# Patient Record
Sex: Female | Born: 1990
Health system: Southern US, Community
[De-identification: ages and names within clinical notes are randomized; demographics above are authoritative.]

## PROBLEM LIST (undated history)

## (undated) ENCOUNTER — Inpatient Hospital Stay: Payer: Self-pay

## (undated) DIAGNOSIS — Z3490 Encounter for supervision of normal pregnancy, unspecified, unspecified trimester: Secondary | ICD-10-CM

## (undated) DIAGNOSIS — E039 Hypothyroidism, unspecified: Secondary | ICD-10-CM

## (undated) DIAGNOSIS — K429 Umbilical hernia without obstruction or gangrene: Secondary | ICD-10-CM

## (undated) DIAGNOSIS — E282 Polycystic ovarian syndrome: Secondary | ICD-10-CM

## (undated) DIAGNOSIS — R112 Nausea with vomiting, unspecified: Secondary | ICD-10-CM

## (undated) DIAGNOSIS — Z9889 Other specified postprocedural states: Secondary | ICD-10-CM

## (undated) HISTORY — PX: APPENDECTOMY: SHX54

## (undated) HISTORY — DX: Polycystic ovarian syndrome: E28.2

## (undated) HISTORY — DX: Hypothyroidism, unspecified: E03.9

## (undated) HISTORY — DX: Encounter for supervision of normal pregnancy, unspecified, unspecified trimester: Z34.90

---

## 2012-03-23 ENCOUNTER — Ambulatory Visit (INDEPENDENT_AMBULATORY_CARE_PROVIDER_SITE_OTHER): Payer: 59 | Admitting: Family Medicine

## 2012-03-23 VITALS — BP 116/62 | HR 88 | Temp 98.3°F | Resp 16 | Ht 65.5 in | Wt 172.0 lb

## 2012-03-23 DIAGNOSIS — M545 Low back pain, unspecified: Secondary | ICD-10-CM

## 2012-03-23 DIAGNOSIS — R112 Nausea with vomiting, unspecified: Secondary | ICD-10-CM

## 2012-03-23 DIAGNOSIS — R110 Nausea: Secondary | ICD-10-CM

## 2012-03-23 DIAGNOSIS — R350 Frequency of micturition: Secondary | ICD-10-CM

## 2012-03-23 DIAGNOSIS — Z139 Encounter for screening, unspecified: Secondary | ICD-10-CM

## 2012-03-23 DIAGNOSIS — N10 Acute tubulo-interstitial nephritis: Secondary | ICD-10-CM

## 2012-03-23 DIAGNOSIS — N12 Tubulo-interstitial nephritis, not specified as acute or chronic: Secondary | ICD-10-CM

## 2012-03-23 LAB — POCT URINALYSIS DIPSTICK
Bilirubin, UA: NEGATIVE
Glucose, UA: NEGATIVE
Ketones, UA: NEGATIVE
Nitrite, UA: NEGATIVE
Spec Grav, UA: 1.02
Urobilinogen, UA: 0.2
pH, UA: 6.5

## 2012-03-23 LAB — POCT UA - MICROSCOPIC ONLY
Casts, Ur, LPF, POC: NEGATIVE
Crystals, Ur, HPF, POC: NEGATIVE
Mucus, UA: NEGATIVE
RBC, urine, microscopic: NEGATIVE
Yeast, UA: NEGATIVE

## 2012-03-23 LAB — POCT URINE PREGNANCY: Preg Test, Ur: NEGATIVE

## 2012-03-23 MED ORDER — LEVOFLOXACIN 750 MG PO TABS: 750.0000 mg | ORAL_TABLET | Freq: Every day | ORAL | Status: AC

## 2012-03-23 MED ORDER — ONDANSETRON 4 MG PO TBDP: 4.0000 mg | ORAL_TABLET | Freq: Three times a day (TID) | ORAL | Status: AC | PRN

## 2012-03-23 MED ORDER — PHENAZOPYRIDINE HCL 100 MG PO TABS: 100.0000 mg | ORAL_TABLET | Freq: Three times a day (TID) | ORAL | Status: AC | PRN

## 2012-03-23 NOTE — Patient Instructions (Addendum)
Return to the clinic or go to the nearest emergency room if any of your symptoms worsen or new symptoms occur. Your should receive a call or letter about your lab results within the next week to 10 days.      Pyelonephritis, Adult Pyelonephritis is a kidney infection. In general, there are 2 main types of pyelonephritis:  Infections that come on quickly without any warning (acute pyelonephritis).   Infections that persist for a long period of time (chronic pyelonephritis).  CAUSES  Two main causes of pyelonephritis are:  Bacteria traveling from the bladder to the kidney. This is a problem especially in pregnant women. The urine in the bladder can become filled with bacteria from multiple causes, including:   Inflammation of the prostate gland (prostatitis).   Sexual intercourse in females.   Bladder infection (cystitis).   Bacteria traveling from the bloodstream to the tissue part of the kidney.  Problems that may increase your risk of getting a kidney infection include:  Diabetes.   Kidney stones or bladder stones.   Cancer.   Catheters placed in the bladder.   Other abnormalities of the kidney or ureter.  SYMPTOMS   Abdominal pain.   Pain in the side or flank area.   Fever.   Chills.   Upset stomach.   Blood in the urine (dark urine).   Frequent urination.   Strong or persistent urge to urinate.   Burning or stinging when urinating.  DIAGNOSIS  Your caregiver may diagnose your kidney infection based on your symptoms. A urine sample may also be taken. TREATMENT  In general, treatment depends on how severe the infection is.   If the infection is mild and caught early, your caregiver may treat you with oral antibiotics and send you home.   If the infection is more severe, the bacteria may have gotten into the bloodstream. This will require intravenous (IV) antibiotics and a hospital stay. Symptoms may include:   High fever.   Severe flank pain.    Shaking chills.   Even after a hospital stay, your caregiver may require you to be on oral antibiotics for a period of time.   Other treatments may be required depending upon the cause of the infection.  HOME CARE INSTRUCTIONS   Take your antibiotics as directed. Finish them even if you start to feel better.   Make an appointment to have your urine checked to make sure the infection is gone.   Drink enough fluids to keep your urine clear or pale yellow.   Take medicines for the bladder if you have urgency and frequency of urination as directed by your caregiver.  SEEK IMMEDIATE MEDICAL CARE IF:   You have a fever.   You are unable to take your antibiotics or fluids.   You develop shaking chills.   You experience extreme weakness or fainting.   There is no improvement after 2 days of treatment.  MAKE SURE YOU:  Understand these instructions.   Will watch your condition.   Will get help right away if you are not doing well or get worse.  Document Released: 08/17/2005 Document Revised: 08/06/2011 Document Reviewed: 01/21/2011 [facility] Patient Information 2012 [place], [place].

## 2012-03-23 NOTE — Progress Notes (Signed)
Subjective:    Patient ID: Adrienne Alvarado, female    DOB: 1991/01/28, 21 y.o.   MRN: [id number]  HPI Adrienne Alvarado is a 21 y.o. female  Low back pain for past month - worse past few days.  No fever.  Dysuria for 2 days - episodic.  Urinary frequency past 1 week. No hematuria.  Vomited last night and 3 days ago.  No fever.  NKI, no change in activity or work.    Tx: none.   LMP 03/14/12 - normal.  Sexually active - engaged, getting married in October. 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  Alcohol - 3 times per week.  Social tobacco use.   Review of Systems  Constitutional: Negative for fever and chills.  Gastrointestinal: Positive for nausea. Negative for vomiting and abdominal pain.  Genitourinary: Positive for dysuria, urgency and frequency. Negative for hematuria, decreased urine volume, vaginal bleeding, vaginal discharge and vaginal pain.  Musculoskeletal: Positive for back pain.  Skin: Negative for rash.       Objective:   Physical Exam  Constitutional: She is oriented to person, place, and time. She appears well-developed and well-nourished.  HENT:  Head: Normocephalic and atraumatic.  Pulmonary/Chest: Effort normal.  Abdominal: Soft. Normal appearance. She exhibits no distension. There is no tenderness. There is CVA tenderness (R sided. ). There is no rebound and no guarding.  Neurological: She is alert and oriented to person, place, and time.  Skin: Skin is warm.  Psychiatric: She has a normal mood and affect. Her behavior is normal.   Results for orders placed in visit on 03/23/12  POCT URINALYSIS DIPSTICK      Component Value Range   Color, UA yellow     Clarity, UA cloudy     Glucose, UA neg     Bilirubin, UA neg     Ketones, UA neg     Spec Grav, UA 1.020     Blood, UA small     pH, UA 6.5     Protein, UA trace     Urobilinogen, UA 0.2     Nitrite, UA neg     Leukocytes, UA small (1+)    POCT UA - MICROSCOPIC ONLY      Component Value Range   WBC, Ur, HPF,  POC 10-15     RBC, urine, microscopic neg     Bacteria, U Microscopic 1+     Mucus, UA neg     Epithelial cells, urine per micros 0-1     Crystals, Ur, HPF, POC neg     Casts, Ur, LPF, POC neg     Yeast, UA neg    POCT URINE PREGNANCY      Component Value Range   Preg Test, Ur Negative         Assessment & Plan:  Adrienne Alvarado is a 21 y.o. female  1. Urine frequency  POCT urinalysis dipstick, POCT UA - Microscopic Only, phenazopyridine (PYRIDIUM) 100 MG tablet  2. Low back pain  POCT urinalysis dipstick, POCT UA - Microscopic Only  3. Screening  POCT urine pregnancy  4. Pyelonephritis  levofloxacin (LEVAQUIN) 750 MG tablet, ondansetron (ZOFRAN ODT) 4 MG disintegrating tablet, Urine culture, phenazopyridine (PYRIDIUM) 100 MG tablet  5. Nausea  ondansetron (ZOFRAN ODT) 4 MG disintegrating tablet    UTI/Possible pyelo with R CVAT, vomiting/nausea. Levaquin 750mg  qd x 5 days. zofran Q8h prn. Fluids, pyridium prn..  Check urine culture.   Recheck in 48 hours if not much improved, sooner if worse. -  RTC precautions, understanding expressed.  Patient Instructions  Return to the clinic or go to the nearest emergency room if any of your symptoms worsen or new symptoms occur. Your should receive a call or letter about your lab results within the next week to 10 days.      Pyelonephritis, Adult Pyelonephritis is a kidney infection. In general, there are 2 main types of pyelonephritis:  Infections that come on quickly without any warning (acute pyelonephritis).   Infections that persist for a long period of time (chronic pyelonephritis).  CAUSES  Two main causes of pyelonephritis are:  Bacteria traveling from the bladder to the kidney. This is a problem especially in pregnant women. The urine in the bladder can become filled with bacteria from multiple causes, including:   Inflammation of the prostate gland (prostatitis).   Sexual intercourse in females.   Bladder  infection (cystitis).   Bacteria traveling from the bloodstream to the tissue part of the kidney.  Problems that may increase your risk of getting a kidney infection include:  Diabetes.   Kidney stones or bladder stones.   Cancer.   Catheters placed in the bladder.   Other abnormalities of the kidney or ureter.  SYMPTOMS   Abdominal pain.   Pain in the side or flank area.   Fever.   Chills.   Upset stomach.   Blood in the urine (dark urine).   Frequent urination.   Strong or persistent urge to urinate.   Burning or stinging when urinating.  DIAGNOSIS  Your caregiver may diagnose your kidney infection based on your symptoms. A urine sample may also be taken. TREATMENT  In general, treatment depends on how severe the infection is.   If the infection is mild and caught early, your caregiver may treat you with oral antibiotics and send you home.   If the infection is more severe, the bacteria may have gotten into the bloodstream. This will require intravenous (IV) antibiotics and a hospital stay. Symptoms may include:   High fever.   Severe flank pain.   Shaking chills.   Even after a hospital stay, your caregiver may require you to be on oral antibiotics for a period of time.   Other treatments may be required depending upon the cause of the infection.  HOME CARE INSTRUCTIONS   Take your antibiotics as directed. Finish them even if you start to feel better.   Make an appointment to have your urine checked to make sure the infection is gone.   Drink enough fluids to keep your urine clear or pale yellow.   Take medicines for the bladder if you have urgency and frequency of urination as directed by your caregiver.  SEEK IMMEDIATE MEDICAL CARE IF:   You have a fever.   You are unable to take your antibiotics or fluids.   You develop shaking chills.   You experience extreme weakness or fainting.   There is no improvement after 2 days of treatment.  MAKE  SURE YOU:  Understand these instructions.   Will watch your condition.   Will get help right away if you are not doing well or get worse.  Document Released: 08/17/2005 Document Revised: 08/06/2011 Document Reviewed: 01/21/2011 [facility] Patient Information 2012 [place], [place].

## 2012-03-26 LAB — URINE CULTURE: Colony Count: >=100000

## 2012-06-09 ENCOUNTER — Ambulatory Visit: Payer: 59 | Admitting: Family Medicine

## 2012-06-09 VITALS — BP 118/68 | HR 106 | Temp 98.5°F | Resp 17 | Ht 65.5 in | Wt 172.0 lb

## 2012-06-09 DIAGNOSIS — N390 Urinary tract infection, site not specified: Secondary | ICD-10-CM

## 2012-06-09 DIAGNOSIS — R300 Dysuria: Secondary | ICD-10-CM

## 2012-06-09 DIAGNOSIS — R319 Hematuria, unspecified: Secondary | ICD-10-CM

## 2012-06-09 LAB — POCT UA - MICROSCOPIC ONLY
Casts, Ur, LPF, POC: NEGATIVE
Crystals, Ur, HPF, POC: NEGATIVE
Yeast, UA: NEGATIVE

## 2012-06-09 LAB — POCT URINALYSIS DIPSTICK
Leukocytes, UA: NEGATIVE
Nitrite, UA: POSITIVE
Protein, UA: 30
Urobilinogen, UA: 2
pH, UA: 5

## 2012-06-09 MED ORDER — CIPROFLOXACIN HCL 500 MG PO TABS: 500.0000 mg | ORAL_TABLET | Freq: Two times a day (BID) | ORAL | Status: DC

## 2012-06-09 MED ORDER — SULFAMETHOXAZOLE-TRIMETHOPRIM 800-160 MG PO TABS: 1.0000 | ORAL_TABLET | Freq: Two times a day (BID) | ORAL | Status: DC

## 2012-06-09 NOTE — Progress Notes (Signed)
[address]   [place], [place]  [address]   [phone]  Subjective:    Patient ID: Adrienne Alvarado, female    DOB: [birth year], 21 y.o.   MRN: [id number]  HPIThis 21 y.o. female presents for evaluation of UTI symptoms. Two days ago onset of dysuria; last night hematuria.  No fever but +chills; no sweats.  +frequency.  Prescribed pain medication and took last night; taking Pyridium with relief.  Nocturia x multiple. +HA; no n/v.  No abdominal pain.  No vaginal discharge.  No vaginal irritation.  +sexually active; old partner; same partner x 3 years.  No history of STDs. History of two kidney infections this year; evaluated at [facility] and diagnosed with pyelonephritis a few months ago.  No flank pain.   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    Review of Systems  Constitutional: Negative for chills, diaphoresis and fatigue.  Gastrointestinal: Negative for nausea, vomiting, abdominal pain, diarrhea and constipation.  Genitourinary: Positive for dysuria, urgency, frequency and hematuria. Negative for flank pain, vaginal bleeding, vaginal discharge, genital sores, vaginal pain, menstrual problem, pelvic pain and dyspareunia.       Objective:   Physical Exam  Nursing note and vitals reviewed. Constitutional: She is oriented to person, place, and time. She appears well-developed and well-nourished. No distress.  Cardiovascular: Normal rate, regular rhythm and normal heart sounds.   Pulmonary/Chest: Effort normal and breath sounds normal.  Abdominal: Soft. Bowel sounds are normal. She exhibits no distension and no mass. There is no tenderness. There is no rebound, no guarding and no CVA tenderness. No hernia.  Neurological: She is alert and oriented to person, place, and time.  Skin: Skin is warm and dry. She is not diaphoretic.  Psychiatric: She  has a normal mood and affect. Her behavior is normal. Judgment and thought content normal.      Results for orders placed in visit on 06/09/12  POCT UA - MICROSCOPIC ONLY      Component Value Range   WBC, Ur, HPF, POC 10-15     RBC, urine, microscopic 1-3     Bacteria, U Microscopic trace     Mucus, UA neg     Epithelial cells, urine per micros 2-4     Crystals, Ur, HPF, POC neg     Casts, Ur, LPF, POC neg     Yeast, UA neg    POCT URINALYSIS DIPSTICK      Component Value Range   Color, UA orange     Clarity, UA clear     Glucose, UA neg     Bilirubin, UA neg     Ketones, UA trace     Spec Grav, UA 1.025     Blood, UA neg     pH, UA 5.0     Protein, UA 30     Urobilinogen, UA 2.0     Nitrite, UA pos     Leukocytes, UA Negative         Assessment & Plan:   1. Burning with urination  POCT UA - Microscopic Only, POCT urinalysis dipstick  2. UTI (lower urinary tract infection)  ciprofloxacin (CIPRO) 500 MG tablet, sulfamethoxazole-trimethoprim (BACTRIM DS,SEPTRA DS) 800-160 MG per tablet, Urine culture    1. UTI: New.  Third UTI in past year; previous pyelonephritis this year.  Send urine culture.  Treat with Septra DS bid  x 10 days.  RTC after completion of antibiotics to confirm normal urine; fast track card provided.  If has one additional UTI this year, recommend urology evaluation.  Pt expressed understanding.   RTC for development of fever, vomiting, flank pain. 2. Hematuria:  New.  Associated with UTI symptoms; RTC after completion of antibiotics to confirm resolution.

## 2012-06-09 NOTE — Patient Instructions (Addendum)
1. Burning with urination  POCT UA - Microscopic Only, POCT urinalysis dipstick  2. UTI (lower urinary tract infection)  ciprofloxacin (CIPRO) 500 MG tablet

## 2012-06-10 ENCOUNTER — Encounter: Payer: Self-pay | Admitting: Family Medicine

## 2012-06-10 NOTE — Progress Notes (Signed)
Reviewed and agree.

## 2012-06-12 LAB — URINE CULTURE

## 2014-03-28 ENCOUNTER — Inpatient Hospital Stay (HOSPITAL_COMMUNITY)
Admission: AD | Admit: 2014-03-28 | Discharge: 2014-03-28 | Disposition: A | Discharge status: Home / Self Care | Payer: 59 | Source: Ambulatory Visit | Attending: Obstetrics & Gynecology | Admitting: Obstetrics & Gynecology

## 2014-03-28 ENCOUNTER — Encounter (HOSPITAL_COMMUNITY): Payer: Self-pay | Admitting: *Deleted

## 2014-03-28 DIAGNOSIS — N949 Unspecified condition associated with female genital organs and menstrual cycle: Secondary | ICD-10-CM | POA: Diagnosis not present

## 2014-03-28 DIAGNOSIS — T192XXA Foreign body in vulva and vagina, initial encounter: Secondary | ICD-10-CM | POA: Diagnosis present

## 2014-03-28 DIAGNOSIS — R102 Pelvic and perineal pain: Secondary | ICD-10-CM

## 2014-03-28 NOTE — Discharge Instructions (Signed)
Toxic Shock Syndrome Toxic shock syndrome (TSS) is a disease that is caused by substances called toxins. The toxins that cause TSS may be made by three different types of bacteria. The toxins get into the bloodstream and damage many vital organs. Fever, rash, swelling of the hands and feet, shock, heart failure, kidney failure, liver failure, and lung damage have all been associated with TSS. TSS can also cause blood clotting and bleeding problems. TSS can be life-threatening. CAUSES  TSS occurs when one of three bacteria (Staphylococcus aureus, Streptococcus pyogenes, or Clostridium sordelli) enter the body through breaks or cracks in the skin or other tissue. The bacteria then produces toxins which enter the bloodstream and cause damage to many organs of the body. Staphylococcal TSS is caused by the bacteria Staphylococcus aureus. It can occur in menstruating women. Risk for this disease increases in women who use:  High absorbency tampons.  Diaphragms.  Sponges.  Cervical caps. Tampons can cause scrapes on the vaginal wall. This damage allows bacteria to enter into the vaginal wall. Super absorbent tampons are more harmful because they expand so much that they actually stick to the vaginal wall. When the tampon is removed, a layer of the vaginal lining may be scraped or peeled off. About half of all episodes of staphylococcal TSS occur in men, children, and menopausal women. In these cases, skin wounds or recent surgical cuts (incisions) are the site of entry for the bacteria. Streptococcal TSS is caused by the bacteria Streptococcus pyogenes. It may occur in healthy or ill children and adults of any age. The entry site of the bacteria into the body may not show obvious signs of infection. This disease can follow minor injuries such as blood clots under the skin, bruises, and muscle strain. It may also occur following chickenpox or a surgical procedure. People who have diabetes or alcoholism seem to  be at increased risk for this disease. Streptococcal TSS may also occur suddenly following childbirth. Clostridial TSS is caused by the bacteria Clostridium sordelli. This bacteria is naturally present in the vagina. The bacteria may enter a woman's tissue and cause this disease following childbirth, abortion, or a procedure or surgery on a woman's sexual organs. SYMPTOMS  Signs of an infection (warmth, redness, swelling, pain, or fluid drainage) where the bacteria entered the body may or may not be found. Symptoms of TSS may include:  High fever.  Vomiting.  Diarrhea.  Sunburn-like rash.  Low blood pressure.  Muscle aches and pains.  Headaches.  Sore throat.  Bloodshot eyes.  Confusion.  Swelling or peeling of the skin on the palms and soles of the feet.  Increase in heart rate. DIAGNOSIS  Your caregiver may perform some of the following tests to determine whether you have TSS:  Blood tests.  Blood, wound, and skin sample cultures.  Cultures from the vagina, mucus (sputum), and throat.  Chest X-rays.  Analysis of urine (urinalysis).  Lumbar puncture to check the spinal fluid. TREATMENT  Hospitalization is usually required. There is no way to predict which individuals with early TSS will develop severe medical problems. People who get proper treatment usually get better within 2 to 3 weeks. Treatment may include:  Immediate removal of the tampon, diaphragm, sponge, or cervical cap, if this applies.  Antibiotic medicines. Antibiotics may be given through an intravenous (IV) access.  IV fluids and medicines to raise low blood pressure and to replace fluids and body salts (sodium, potassium) lost from vomiting or diarrhea.  Dialysis if  there is severe kidney involvement. A kidney transplant may be necessary in rare cases.  Surgery to remove infected tissue. This is rare, except in some cases of streptococcal TSS. PREVENTION  Follow recommended tampon  guidelines.  Avoid super absorbent tampons.  Alternate the use of tampons with sanitary napkins or pads during your period. Use tampons during the day and pads at night.  Never leave a tampon inserted overnight.  Change tampons often, at least every 4 to 6 hours.  Do not leave in diaphragms, sponges, or cervical caps longer than instructed on the label.  Wash your hands with soap and warm water before inserting and removing a tampon, diaphragm, sponge, or cervical cap.  When inserting a tampon, be extremely careful not to scratch the vaginal lining. Plastic applicators may cause an increased risk because of the sharp edges produced when opening the applicator. If your vagina seems dry, use a water-soluble lubricating jelly. This will ease insertion.  Do not use a tampon between periods. It may dry out the vagina.  Use 100% cotton tampons instead of those made with synthetic materials.  Always choose the tampon with the least absorbency to meet your needs.  At the first sign of a fever or rash, remove the tampon immediately.  Do not leave your diaphragm or sponge in for longer than 12 hours.  Do not use tampons, a sponge, a cervical cap, or a diaphragm for 12 weeks after delivering a baby.  Do not use gauze packing for a nosebleed.  Wash cuts and scrapes with warm water and soap.  Keep scrapes and cuts covered to prevent infection from developing. HOME CARE INSTRUCTIONS  Take your antibiotics as directed. Finish them even if you start to feel better.  Women who had TSS should not use tampons ever again. SEEK IMMEDIATE MEDICAL CARE IF:   You have chills, vomiting, diarrhea, or a rash.  You have a convulsion or faint.  You have a cut or surgery and the skin around the wound becomes red, swollen, or drains pus.  You have a fever.  You have red streaks on the skin coming from an injury, piercing, or tattoo.  You just had a baby, within 12 weeks, and one of your children  has strep throat. Document Released: 11/07/2002 Document Revised: 11/09/2011 Document Reviewed: 04/16/2011 [facility] Patient Information 2015 [place], [place]. This information is not intended to replace advice given to you by your health care provider. Make sure you discuss any questions you have with your health care provider.

## 2014-03-28 NOTE — MAU Provider Note (Signed)
  History     CSN: [id number]  Arrival date and time: 03/28/14 0930   First Provider Initiated Contact with Patient 03/28/14 1007      Chief Complaint  Patient presents with  . stuck tampon    HPI [name] is a 23 y.o. G0P0 who presents to MAU today with concern for a retained tampon. The patient states that she last remembers placing the tampon on Sunday. She has noted some bleeding this morning, which she feels is trauma from the attempted removal. She is having vaginal discomfort. She denies abdominal pain, N/V or fever.   OB History   Grav Para Term Preterm Abortions TAB SAB Ect Mult Living   0               Past Medical History  Diagnosis Date  . Medical history non-contributory     Past Surgical History  Procedure Laterality Date  . Appendectomy      History reviewed. No pertinent family history.  History  Substance Use Topics  . Smoking status: Passive Smoke Exposure - Never Smoker -- 0.25 packs/day for 2 years    Types: Cigarettes  . Smokeless tobacco: Not on file  . Alcohol Use: 4.2 oz/week    7 Glasses of wine per week    Allergies:  Allergies  Allergen Reactions  . Amoxicillin     childhood  . Muscle Relief [Capsaicin] Nausea And Vomiting  . Penicillins Other (See Comments)    Childhood     No prescriptions prior to admission    Review of Systems  Constitutional: Negative for fever and malaise/fatigue.  Gastrointestinal: Negative for nausea, vomiting and abdominal pain.  Genitourinary:       + vaginal bleeding   Physical Exam   Blood pressure 116/76, pulse 67, temperature 99.1 F (37.3 C), temperature source Oral, resp. rate 18, height 5' 5.25" (1.657 m), weight 200 lb (90.719 kg), last menstrual period 03/22/2014, SpO2 100.00%.  Physical Exam  Constitutional: She is oriented to person, place, and time. She appears well-developed and well-nourished. No distress.  HENT:  Head: Normocephalic and atraumatic.   Cardiovascular: Normal rate.   Respiratory: Effort normal.  Genitourinary: Cervix exhibits no motion tenderness, no discharge and no friability. No bleeding around the vagina. There are signs of injury (small area of the anterior vaginal wall with erythema and scant bleeding) around the vagina. Vaginal discharge (scant thin, white discharge noted) found.  No retained tampon noted in the vagina  Neurological: She is alert and oriented to person, place, and time.  Skin: Skin is warm and dry. No erythema.  Psychiatric: She has a normal mood and affect.     MAU Course  Procedures None  MDM No foreign body noted on vaginal exam  Assessment and Plan  A: Vaginal pain  P: Discharge home Patient advised pelvic rest x 1-2 weeks Patient may return to MAU as needed or if her condition were to change or worsen   Freddi StarrJulie N Ethier, PA-C  03/28/2014, 10:08 AM

## 2014-03-28 NOTE — MAU Provider Note (Signed)
Attestation of Attending Supervision of Advanced Practitioner (PA/CNM/NP): Evaluation and management procedures were performed by the Advanced Practitioner under my supervision and collaboration.  I have reviewed the Advanced Practitioner's note and chart, and I agree with the management and plan.  Buster Schueller, MD, FACOG Attending Obstetrician & Gynecologist Faculty Practice, [facility]   

## 2014-03-28 NOTE — MAU Note (Signed)
Cycle ended on Sunday.  Realized yesterday had stuck tampon. Tired to remove it and was unable, causing pain and started bleeding from attempts.

## 2016-09-29 ENCOUNTER — Encounter: Payer: Self-pay | Admitting: Unknown Physician Specialty

## 2016-09-29 ENCOUNTER — Ambulatory Visit (INDEPENDENT_AMBULATORY_CARE_PROVIDER_SITE_OTHER): Payer: 59 | Admitting: Unknown Physician Specialty

## 2016-09-29 DIAGNOSIS — Z8639 Personal history of other endocrine, nutritional and metabolic disease: Secondary | ICD-10-CM | POA: Diagnosis not present

## 2016-09-29 DIAGNOSIS — E282 Polycystic ovarian syndrome: Secondary | ICD-10-CM

## 2016-09-29 DIAGNOSIS — Z7689 Persons encountering health services in other specified circumstances: Secondary | ICD-10-CM | POA: Diagnosis not present

## 2016-09-29 LAB — BAYER DCA HB A1C WAIVED: HB A1C (BAYER DCA - WAIVED): 5.3 % (ref <7.0)

## 2016-09-29 MED ORDER — METFORMIN HCL 500 MG PO TABS: 500.0000 mg | ORAL_TABLET | Freq: Three times a day (TID) | ORAL | 3 refills | Status: DC

## 2016-09-29 NOTE — Patient Instructions (Addendum)
Polycystic Ovarian Syndrome Polycystic ovarian syndrome (PCOS) is a common hormonal disorder among women of reproductive age. In most women with PCOS, many small fluid-filled sacs (cysts) grow on the ovaries, and the cysts are not part of a normal menstrual cycle. PCOS can cause problems with your menstrual periods and make it difficult to get pregnant. It can also cause an increased risk of miscarriage with pregnancy. If it is not treated, PCOS can lead to serious health problems, such as diabetes and heart disease. What are the causes? The cause of PCOS is not known, but it may be the result of a combination of certain factors, such as:  Irregular menstrual cycle.  High levels of certain hormones (androgens).  Problems with the hormone that helps to control blood sugar (insulin resistance).  Certain genes. What increases the risk? This condition is more likely to develop in women who have a family history of PCOS. What are the signs or symptoms? Symptoms of PCOS may include:  Multiple ovarian cysts.  Infrequent periods or no periods.  Periods that are too frequent or too heavy.  Unpredictable periods.  Inability to get pregnant (infertility) because of not ovulating.  Increased growth of hair on the face, chest, stomach, back, thumbs, thighs, or toes.  Acne or oily skin. Acne may develop during adulthood, and it may not respond to treatment.  Pelvic pain.  Weight gain or obesity.  Patches of thickened and dark brown or black skin on the neck, arms, breasts, or thighs (acanthosis nigricans).  Excess hair growth on the face, chest, abdomen, or upper thighs (hirsutism). How is this diagnosed? This condition is diagnosed based on:  Your medical history.  A physical exam, including a pelvic exam. Your health care provider may look for areas of increased hair growth on your skin.  Tests, such as:  Ultrasound. This may be used to examine the ovaries and the lining of the  uterus (endometrium) for cysts.  Blood tests. These may be used to check levels of sugar (glucose), female hormone (testosterone), and female hormones (estrogen and progesterone) in your blood. How is this treated? There is no cure for PCOS, but treatment can help to manage symptoms and prevent more health problems from developing. Treatment varies depending on:  Your symptoms.  Whether you want to have a baby or whether you need birth control (contraception). Treatment may include nutrition and lifestyle changes along with:  Progesterone hormone to start a menstrual period.  Birth control pills to help you have regular menstrual periods.  Medicines to make you ovulate, if you want to get pregnant.  Medicine to reduce excessive hair growth.  Surgery, in severe cases. This may involve making small holes in one or both of your ovaries. This decreases the amount of testosterone that your body produces. Follow these instructions at home:  Take over-the-counter and prescription medicines only as told by your health care provider.  Follow a healthy meal plan. This can help you reduce the effects of PCOS.  Eat a healthy diet that includes lean proteins, complex carbohydrates, fresh fruits and vegetables, low-fat dairy products, and healthy fats. Make sure to eat enough fiber.  If you are overweight, lose weight as told by your health care provider.  Losing 10% of your body weight may improve symptoms.  Your health care provider can determine how much weight loss is best for you and can help you lose weight safely.  Keep all follow-up visits as told by your health care provider. This  is important. Contact a health care provider if:  Your symptoms do not get better with medicine.  You develop new symptoms. This information is not intended to replace advice given to you by your health care provider. Make sure you discuss any questions you have with your health care provider. Document  Released: 12/11/2004 Document Revised: 04/14/2016 Document Reviewed: 02/02/2016 Elsevier Interactive Patient Education  2017 [employer]  

## 2016-09-29 NOTE — Progress Notes (Signed)
BP 106/69 (BP Location: Left Arm, Patient Position: Sitting, Cuff Size: Large)   Pulse 76   Temp 98.3 F (36.8 C)   Ht 5' 7.2" (1.707 m)   Wt 228 lb 3.2 oz (103.5 kg)   LMP 09/28/2016 (Exact Date)   SpO2 97%   BMI 35.53 kg/m    Subjective:    Patient ID: Adrienne Alvarado, female    DOB: 02-11-1991, 26 y.o.   MRN: [id number]  HPI: Adrienne Alvarado is a 26 y.o. female  Chief Complaint  Patient presents with  . Establish Care    pt states she would like to discuss weight gain, PCOS, and thyroid. States she had hypothyroid in the past   . PCOS    pt states she was diagnosed about 2 and a half years ago with PCOS and states she has high testosterone levels    Social History   Social History  . Marital status: Single    Spouse name: N/A  . Number of children: N/A  . Years of education: N/A   Occupational History  . Not on file.   Social History Main Topics  . Smoking status: Former Smoker    Packs/day: 0.25    Years: 2.00    Types: Cigarettes    Quit date: 02/03/2016  . Smokeless tobacco: Never Used  . Alcohol use Yes     Comment: on occasion  . Drug use: No  . Sexual activity: Yes    Birth control/ protection: None   Other Topics Concern  . Not on file   Social History Narrative   Marital status: single     Children: none     Employment: bartender   Family History  Problem Relation Age of Onset  . Cancer Mother     breast  . Diabetes Mother     type 2   Past Medical History:  Diagnosis Date  . Hypothyroid   . Medical history non-contributory   . PCOS (polycystic ovarian syndrome)    Past Surgical History:  Procedure Laterality Date  . APPENDECTOMY     PCOS Diagnosed August 2015 and had a vaginal ultrasound.  She was told her testosterone levels were "very high." She has gained over 100 pounds in the last several years.  Initially went to the doctor because of weight gain.  Particularly concerned about hair growth on chin and around nipples.  Worked  with a Systems analystpersonal trainer and no weight loss despite dieting and exercise (exercise 4 days/week, paleo/keto diet).  She lacks sex drive.  In the past she was on BCPs and they made her very sick.  She would like to start a family in the next couple of years.  She has not used anything for birth control for the past 5 years and never got pregnant.    Relevant past medical, surgical, family and social history reviewed and updated as indicated. Interim medical history since our last visit reviewed. Allergies and medications reviewed and updated.  Review of Systems  Per HPI unless specifically indicated above     Objective:    BP 106/69 (BP Location: Left Arm, Patient Position: Sitting, Cuff Size: Large)   Pulse 76   Temp 98.3 F (36.8 C)   Ht 5' 7.2" (1.707 m)   Wt 228 lb 3.2 oz (103.5 kg)   LMP 09/28/2016 (Exact Date)   SpO2 97%   BMI 35.53 kg/m   Wt Readings from Last 3 Encounters:  09/29/16 228 lb 3.2 oz (103.5 kg)  03/28/14 200 lb (90.7 kg)  06/09/12 172 lb (78 kg)    Physical Exam  Constitutional: She is oriented to person, place, and time. She appears well-developed and well-nourished. No distress.  HENT:  Head: Normocephalic and atraumatic.  Eyes: Conjunctivae and lids are normal. Right eye exhibits no discharge. Left eye exhibits no discharge. No scleral icterus.  Neck: Normal range of motion. Neck supple. No JVD present. Carotid bruit is not present.  Cardiovascular: Normal rate, regular rhythm and normal heart sounds.   Pulmonary/Chest: Effort normal and breath sounds normal.  Abdominal: Normal appearance. There is no splenomegaly or hepatomegaly.  Musculoskeletal: Normal range of motion.  Neurological: She is alert and oriented to person, place, and time.  Skin: Skin is warm, dry and intact. No rash noted. No pallor.  Psychiatric: She has a normal mood and affect. Her behavior is normal. Judgment and thought content normal.     Assessment & Plan:   Problem List Items  Addressed This Visit      Unprioritized   Encounter to establish care   History of hypothyroidism as a child   Relevant Orders   TSH   PCOS (polycystic ovarian syndrome)    Rx for Metformin with meals.  Gradual increase in dosing.  Recheck 6 weeks and consider addition of Contrave which helped her mom but will need back up birth control      Relevant Medications   metFORMIN (GLUCOPHAGE) 500 MG tablet   Other Relevant Orders   CBC with Differential/Platelet   Comprehensive metabolic panel   Lipid Panel w/o Chol/HDL Ratio   Bayer DCA Hb A1c Waived      Fasting today  Follow up plan: Return in about 6 weeks (around 11/10/2016).

## 2016-09-29 NOTE — Assessment & Plan Note (Signed)
Rx for Metformin with meals.  Gradual increase in dosing.  Recheck 6 weeks and consider addition of Contrave which helped her mom but will need back up birth control

## 2016-09-30 ENCOUNTER — Encounter: Payer: Self-pay | Admitting: Unknown Physician Specialty

## 2016-09-30 LAB — CBC WITH DIFFERENTIAL/PLATELET
BASOS ABS: 0 10*3/uL (ref 0.0–0.2)
Basos: 1 %
EOS (ABSOLUTE): 0.2 10*3/uL (ref 0.0–0.4)
EOS: 4 %
HEMATOCRIT: 38.2 % (ref 34.0–46.6)
HEMOGLOBIN: 12.4 g/dL (ref 11.1–15.9)
Immature Grans (Abs): 0 10*3/uL (ref 0.0–0.1)
Immature Granulocytes: 0 %
LYMPHS: 39 %
Lymphocytes Absolute: 2.1 10*3/uL (ref 0.7–3.1)
MCH: 29 pg (ref 26.6–33.0)
MCHC: 32.5 g/dL (ref 31.5–35.7)
MCV: 90 fL (ref 79–97)
MONOCYTES: 10 %
Monocytes Absolute: 0.5 10*3/uL (ref 0.1–0.9)
Neutrophils Absolute: 2.6 10*3/uL (ref 1.4–7.0)
Neutrophils: 46 %
Platelets: 378 10*3/uL (ref 150–379)
RBC: 4.27 x10E6/uL (ref 3.77–5.28)
RDW: 13.3 % (ref 12.3–15.4)
WBC: 5.5 10*3/uL (ref 3.4–10.8)

## 2016-09-30 LAB — COMPREHENSIVE METABOLIC PANEL
ALBUMIN: 3.9 g/dL (ref 3.5–5.5)
ALT: 20 IU/L (ref 0–32)
AST: 26 IU/L (ref 0–40)
Albumin/Globulin Ratio: 1.4 (ref 1.2–2.2)
Alkaline Phosphatase: 38 IU/L — ABNORMAL LOW (ref 39–117)
BUN/Creatinine Ratio: 15 (ref 9–23)
BUN: 9 mg/dL (ref 6–20)
Bilirubin Total: 0.2 mg/dL (ref 0.0–1.2)
CALCIUM: 8.9 mg/dL (ref 8.7–10.2)
CHLORIDE: 106 mmol/L (ref 96–106)
CO2: 19 mmol/L (ref 18–29)
Creatinine, Ser: 0.62 mg/dL (ref 0.57–1.00)
GFR, EST AFRICAN AMERICAN: 144 mL/min/{1.73_m2} (ref >59)
GFR, EST NON AFRICAN AMERICAN: 125 mL/min/{1.73_m2} (ref >59)
GLUCOSE: 91 mg/dL (ref 65–99)
Globulin, Total: 2.7 g/dL (ref 1.5–4.5)
Potassium: 4.8 mmol/L (ref 3.5–5.2)
Sodium: 140 mmol/L (ref 134–144)
TOTAL PROTEIN: 6.6 g/dL (ref 6.0–8.5)

## 2016-09-30 LAB — LIPID PANEL W/O CHOL/HDL RATIO
Cholesterol, Total: 126 mg/dL (ref 100–199)
HDL: 39 mg/dL — AB (ref >39)
LDL CALC: 77 mg/dL (ref 0–99)
Triglycerides: 52 mg/dL (ref 0–149)
VLDL CHOLESTEROL CAL: 10 mg/dL (ref 5–40)

## 2016-09-30 LAB — TSH: TSH: 2.4 u[IU]/mL (ref 0.450–4.500)

## 2016-09-30 NOTE — Progress Notes (Signed)
Notified pt by mychart

## 2016-11-23 ENCOUNTER — Ambulatory Visit: Payer: 59 | Admitting: Unknown Physician Specialty

## 2016-11-26 DIAGNOSIS — H5213 Myopia, bilateral: Secondary | ICD-10-CM | POA: Diagnosis not present

## 2016-11-26 DIAGNOSIS — H52223 Regular astigmatism, bilateral: Secondary | ICD-10-CM | POA: Diagnosis not present

## 2016-11-30 ENCOUNTER — Encounter: Payer: Self-pay | Admitting: Unknown Physician Specialty

## 2016-11-30 ENCOUNTER — Ambulatory Visit (INDEPENDENT_AMBULATORY_CARE_PROVIDER_SITE_OTHER): Payer: 59 | Admitting: Unknown Physician Specialty

## 2016-11-30 DIAGNOSIS — E282 Polycystic ovarian syndrome: Secondary | ICD-10-CM | POA: Diagnosis not present

## 2016-11-30 MED ORDER — METFORMIN HCL 500 MG PO TABS: 500.0000 mg | ORAL_TABLET | Freq: Three times a day (TID) | ORAL | 1 refills | Status: DC

## 2016-11-30 NOTE — Assessment & Plan Note (Signed)
Doing well with Metformin.  Continue present

## 2016-11-30 NOTE — Progress Notes (Signed)
BP 110/74 (BP Location: Left Arm, Patient Position: Sitting, Cuff Size: Large)   Pulse 94   Temp 98.8 F (37.1 C)   Wt 202 lb 6.4 oz (91.8 kg)   LMP 10/31/2016 (Exact Date)   SpO2 97%   BMI 31.51 kg/m    Subjective:    Patient ID: Adrienne Alvarado, female    DOB: 17-Nov-1990, 26 y.o.   MRN: [id number]  HPI: Adrienne Alvarado is a 26 y.o. female  Chief Complaint  Patient presents with  . PCOS    f/up after starting metformin   PCOS - Pt started on Metformin last visit.  She has lost a lot of weight with this regimen.  She has lost 16 pounds.  She is still concerned with hair growth.  She continue with her low carb diet and exercising.  She feels well.  Menses are regular and not on birth control  Relevant past medical, surgical, family and social history reviewed and updated as indicated. Interim medical history since our last visit reviewed. Allergies and medications reviewed and updated.  Review of Systems  All other systems reviewed and are negative.   Per HPI unless specifically indicated above     Objective:    BP 110/74 (BP Location: Left Arm, Patient Position: Sitting, Cuff Size: Large)   Pulse 94   Temp 98.8 F (37.1 C)   Wt 202 lb 6.4 oz (91.8 kg)   LMP 10/31/2016 (Exact Date)   SpO2 97%   BMI 31.51 kg/m   Wt Readings from Last 3 Encounters:  11/30/16 202 lb 6.4 oz (91.8 kg)  09/29/16 228 lb 3.2 oz (103.5 kg)  03/28/14 200 lb (90.7 kg)    Physical Exam  Constitutional: She is oriented to person, place, and time. She appears well-developed and well-nourished. No distress.  HENT:  Head: Normocephalic and atraumatic.  Eyes: Conjunctivae and lids are normal. Right eye exhibits no discharge. Left eye exhibits no discharge. No scleral icterus.  Neck: Normal range of motion. Neck supple. No JVD present. Carotid bruit is not present.  Cardiovascular: Normal rate, regular rhythm and normal heart sounds.   Pulmonary/Chest: Effort normal and breath sounds normal.    Abdominal: Normal appearance. There is no splenomegaly or hepatomegaly.  Musculoskeletal: Normal range of motion.  Neurological: She is alert and oriented to person, place, and time.  Skin: Skin is warm, dry and intact. No rash noted. No pallor.  Psychiatric: She has a normal mood and affect. Her behavior is normal. Judgment and thought content normal.    Results for orders placed or performed in visit on 09/29/16  CBC with Differential/Platelet  Result Value Ref Range   WBC 5.5 3.4 - 10.8 x10E3/uL   RBC 4.27 3.77 - 5.28 x10E6/uL   Hemoglobin 12.4 11.1 - 15.9 g/dL   Hematocrit 40.9 81.1 - 46.6 %   MCV 90 79 - 97 fL   MCH 29.0 26.6 - 33.0 pg   MCHC 32.5 31.5 - 35.7 g/dL   RDW 91.4 78.2 - 95.6 %   Platelets 378 150 - 379 x10E3/uL   Neutrophils 46 Not Estab. %   Lymphs 39 Not Estab. %   Monocytes 10 Not Estab. %   Eos 4 Not Estab. %   Basos 1 Not Estab. %   Neutrophils Absolute 2.6 1.4 - 7.0 x10E3/uL   Lymphocytes Absolute 2.1 0.7 - 3.1 x10E3/uL   Monocytes Absolute 0.5 0.1 - 0.9 x10E3/uL   EOS (ABSOLUTE) 0.2 0.0 - 0.4 x10E3/uL  Basophils Absolute 0.0 0.0 - 0.2 x10E3/uL   Immature Granulocytes 0 Not Estab. %   Immature Grans (Abs) 0.0 0.0 - 0.1 x10E3/uL  TSH  Result Value Ref Range   TSH 2.400 0.450 - 4.500 uIU/mL  Comprehensive metabolic panel  Result Value Ref Range   Glucose 91 65 - 99 mg/dL   BUN 9 6 - 20 mg/dL   Creatinine, Ser 4.09 0.57 - 1.00 mg/dL   GFR calc non Af Amer 125 >59 mL/min/1.73   GFR calc Af Amer 144 >59 mL/min/1.73   BUN/Creatinine Ratio 15 9 - 23   Sodium 140 134 - 144 mmol/L   Potassium 4.8 3.5 - 5.2 mmol/L   Chloride 106 96 - 106 mmol/L   CO2 19 18 - 29 mmol/L   Calcium 8.9 8.7 - 10.2 mg/dL   Total Protein 6.6 6.0 - 8.5 g/dL   Albumin 3.9 3.5 - 5.5 g/dL   Globulin, Total 2.7 1.5 - 4.5 g/dL   Albumin/Globulin Ratio 1.4 1.2 - 2.2   Bilirubin Total 0.2 0.0 - 1.2 mg/dL   Alkaline Phosphatase 38 (L) 39 - 117 IU/L   AST 26 0 - 40 IU/L   ALT 20 0  - 32 IU/L  Lipid Panel w/o Chol/HDL Ratio  Result Value Ref Range   Cholesterol, Total 126 100 - 199 mg/dL   Triglycerides 52 0 - 149 mg/dL   HDL 39 (L) >81 mg/dL   VLDL Cholesterol Cal 10 5 - 40 mg/dL   LDL Calculated 77 0 - 99 mg/dL  Bayer DCA Hb X9J Waived  Result Value Ref Range   Bayer DCA Hb A1c Waived 5.3 <7.0 %      Assessment & Plan:   Problem List Items Addressed This Visit      Unprioritized   PCOS (polycystic ovarian syndrome)    Doing well with Metformin.  Continue present      Relevant Medications   metFORMIN (GLUCOPHAGE) 500 MG tablet   Other Relevant Orders   Bayer DCA Hb A1c Waived   Comprehensive metabolic panel       Follow up plan: Return in about 6 months (around 06/01/2017).

## 2016-12-01 LAB — COMPREHENSIVE METABOLIC PANEL
ALK PHOS: 44 IU/L (ref 39–117)
ALT: 15 IU/L (ref 0–32)
AST: 15 IU/L (ref 0–40)
Albumin/Globulin Ratio: 1.6 (ref 1.2–2.2)
Albumin: 4.3 g/dL (ref 3.5–5.5)
BILIRUBIN TOTAL: 0.2 mg/dL (ref 0.0–1.2)
BUN/Creatinine Ratio: 19 (ref 9–23)
BUN: 14 mg/dL (ref 6–20)
CHLORIDE: 102 mmol/L (ref 96–106)
CO2: 19 mmol/L (ref 18–29)
CREATININE: 0.74 mg/dL (ref 0.57–1.00)
Calcium: 9.2 mg/dL (ref 8.7–10.2)
GFR calc Af Amer: 129 mL/min/{1.73_m2} (ref >59)
GFR calc non Af Amer: 112 mL/min/{1.73_m2} (ref >59)
GLUCOSE: 80 mg/dL (ref 65–99)
Globulin, Total: 2.7 g/dL (ref 1.5–4.5)
Potassium: 3.9 mmol/L (ref 3.5–5.2)
Sodium: 138 mmol/L (ref 134–144)
Total Protein: 7 g/dL (ref 6.0–8.5)

## 2016-12-01 LAB — BAYER DCA HB A1C WAIVED: HB A1C: 5.1 % (ref <7.0)

## 2016-12-01 NOTE — Progress Notes (Signed)
Normal labs.  Pt notified through mychart

## 2017-06-07 ENCOUNTER — Ambulatory Visit: Payer: 59 | Admitting: Unknown Physician Specialty

## 2017-06-29 ENCOUNTER — Other Ambulatory Visit: Payer: Self-pay | Admitting: Unknown Physician Specialty

## 2017-06-29 DIAGNOSIS — E282 Polycystic ovarian syndrome: Secondary | ICD-10-CM

## 2017-08-31 NOTE — L&D Delivery Note (Signed)
      Delivery Note   Adrienne Alvarado is a 27 y.o. G1P0 at [redacted]w[redacted]d Estimated Date of Delivery: 06/21/18  PRE-OPERATIVE DIAGNOSIS:  1) [redacted]w[redacted]d pregnancy.   POST-OPERATIVE DIAGNOSIS:  1) [redacted]w[redacted]d pregnancy s/p Vaginal, Spontaneous   Delivery Type: Vaginal, Spontaneous    Delivery Anesthesia: Epidural   Labor Complications:   none     ESTIMATED BLOOD LOSS: 200 ml    FINDINGS:   1) female infant, Apgar scores of 8    at 1 minute and 9    at 5 minutes and a birthweight of 109  ounces.  ( 6lbs 13oz.)  2) Nuchal cord: No   SPECIMENS:   PLACENTA:   Appearance:   intact , 3 vessel cord   Removal: Spontaneous      Disposition:   held per protocol then discarded  DISPOSITION:  Infant to left in stable condition in the delivery room, with L&D personnel and mother,  NARRATIVE SUMMARY: Labor course:  [name] is a G1P0 at [redacted]w[redacted]d who presented for induction of labor.  She progressed well in labor with pitocin.  She received the appropriate anesthesia and proceeded to complete dilation. She evidenced good maternal expulsive effort during the second stage. She went on to deliver a viable female infant " Adrienne Alvarado". The placenta delivered without problems and was noted to be complete. A perineal and vaginal examination was performed. Lacerations: 2nd degree;Perineal . Laceration was repaired with 3-0 Vicryl Rapide suture. The patient tolerated this well.  Doreene Burke, CNM  06/24/2018 6:44 PM

## 2017-10-06 ENCOUNTER — Other Ambulatory Visit: Payer: Self-pay | Admitting: Unknown Physician Specialty

## 2017-10-06 DIAGNOSIS — E282 Polycystic ovarian syndrome: Secondary | ICD-10-CM

## 2017-10-06 NOTE — Telephone Encounter (Signed)
Pt due to be seen

## 2017-10-25 ENCOUNTER — Telehealth: Payer: Self-pay | Admitting: Certified Nurse Midwife

## 2017-11-02 ENCOUNTER — Ambulatory Visit (INDEPENDENT_AMBULATORY_CARE_PROVIDER_SITE_OTHER): Payer: 59 | Admitting: Certified Nurse Midwife

## 2017-11-02 ENCOUNTER — Encounter: Payer: Self-pay | Admitting: Certified Nurse Midwife

## 2017-11-02 ENCOUNTER — Encounter: Payer: 59 | Admitting: Certified Nurse Midwife

## 2017-11-02 VITALS — BP 121/54 | HR 76 | Ht 67.5 in | Wt 182.3 lb

## 2017-11-02 DIAGNOSIS — N912 Amenorrhea, unspecified: Secondary | ICD-10-CM | POA: Diagnosis not present

## 2017-11-02 LAB — POCT URINE PREGNANCY: Preg Test, Ur: POSITIVE — AB

## 2017-11-02 MED ORDER — DOXYLAMINE-PYRIDOXINE ER 20-20 MG PO TBCR: 1.0000 | EXTENDED_RELEASE_TABLET | Freq: Three times a day (TID) | ORAL | 2 refills | Status: DC

## 2017-11-02 NOTE — Patient Instructions (Signed)

## 2017-11-02 NOTE — Progress Notes (Signed)
Subjective:    Adrienne Alvarado is a 27 y.o. female who presents for evaluation of amenorrhea. She believes she could be pregnant. Pregnancy is desired. Sexual Activity: single partner, contraception: none. Current symptoms also include: fatigue, nausea and positive home pregnancy test. Last period was normal.   Patient's last menstrual period was 09/14/2017 (exact date). The following portions of the patient's history were reviewed and updated as appropriate: allergies, current medications, past family history, past medical history, past social history, past surgical history and problem list.  Review of Systems Pertinent items are noted in HPI.     Objective:    BP (!) 121/54   Pulse 76   Ht 5' 7.5" (1.715 m)   Wt 182 lb 5 oz (82.7 kg)   LMP 09/14/2017 (Exact Date)   BMI 28.13 kg/m  General: alert, cooperative, appears stated age and no acute distress    Lab Review Urine HCG: positive    Assessment:    Absence of menstruation.     Plan:    Pregnancy Test: Briefly discussed pre-natal care options. [facility]. Pt encouraged to let us know when she has decided on care team.  Encouraged well-balanced diet, plenty of rest when needed, pre-natal vitamins daily and exercise. Discussed self-help for nausea, avoiding OTC medications until consulting provider or pharmacist, other than Tylenol as needed, minimal caffeine (1-2 cups daily) and avoiding alcohol. Bonjesta ordered today. She will schedule her initial nurse visit @ 10 wks and her NOB physical exam @ 12 wks. She verbalizes understanding and agrees.   Doreene BurkeAnnie Clyde Upshaw, CNM

## 2017-11-02 NOTE — Progress Notes (Signed)
Pt is here for a confirmation of pregnancy. C/o N/V breast tenderness. Samples of Bonjesta given to pt but script also sent in.

## 2017-11-04 ENCOUNTER — Other Ambulatory Visit: Payer: Self-pay

## 2017-11-04 MED ORDER — VITAFOL GUMMIES 3.33-0.333-34.8 MG PO CHEW: 1.0000 | CHEWABLE_TABLET | Freq: Every day | ORAL | 9 refills | Status: DC

## 2017-11-04 MED ORDER — VITAFOL GUMMIES 3.33-0.333-34.8 MG PO CHEW: 3.0000 | CHEWABLE_TABLET | Freq: Every day | ORAL | 9 refills | Status: DC

## 2017-11-18 NOTE — Telephone Encounter (Signed)
Error

## 2017-11-21 ENCOUNTER — Encounter: Payer: Self-pay | Admitting: Certified Nurse Midwife

## 2017-11-22 ENCOUNTER — Ambulatory Visit (INDEPENDENT_AMBULATORY_CARE_PROVIDER_SITE_OTHER): Payer: 59 | Admitting: Certified Nurse Midwife

## 2017-11-22 ENCOUNTER — Other Ambulatory Visit: Payer: Self-pay

## 2017-11-22 VITALS — BP 109/70 | HR 79 | Ht 67.5 in | Wt 180.9 lb

## 2017-11-22 DIAGNOSIS — Z202 Contact with and (suspected) exposure to infections with a predominantly sexual mode of transmission: Secondary | ICD-10-CM

## 2017-11-22 DIAGNOSIS — Z3401 Encounter for supervision of normal first pregnancy, first trimester: Secondary | ICD-10-CM | POA: Diagnosis not present

## 2017-11-22 DIAGNOSIS — O219 Vomiting of pregnancy, unspecified: Secondary | ICD-10-CM

## 2017-11-22 LAB — OB RESULTS CONSOLE VARICELLA ZOSTER ANTIBODY, IGG: VARICELLA IGG: IMMUNE

## 2017-11-22 MED ORDER — DOXYLAMINE-PYRIDOXINE 10-10 MG PO TBEC: 1.0000 | DELAYED_RELEASE_TABLET | Freq: Three times a day (TID) | ORAL | 1 refills | Status: DC

## 2017-11-22 MED ORDER — DOXYLAMINE-PYRIDOXINE 10-10 MG PO TBEC: 10.0000 mg | DELAYED_RELEASE_TABLET | Freq: Every day | ORAL | 1 refills | Status: DC

## 2017-11-22 NOTE — Progress Notes (Signed)
Adrienne Alvarado presents for Adrienne Alvarado. Pregnancy confirmation done __[facility] by [facility] on 3/5/2019_.  G1- .  P000. LMP- 09/14/2017 (certain) EDD- 06/21/2018. Pt c/o of n/v. Given Bonjesta at confirmation. Pt can not afford it. Sent in [facility]ic[facility]- per [facility] no charge to pt.   Pregnancy education material explained and given. _0__ cats in the home. Adrienne labs ordered.  HIV labs and Drug screen were explained and ordered.  PNV encouraged. Genetic screening options discussed. Genetic testing: Unsure. Last pap 2015- wnl. Pt may discuss with provider at [facility]. Pt. To follow up with provider in_ 2_ weeks for Adrienne physical.  All questions answered.

## 2017-11-22 NOTE — Patient Instructions (Signed)
WHAT OB PATIENTS CAN EXPECT   Confirmation of pregnancy and ultrasound ordered if medically indicated-[redacted] weeks gestation  New OB (NOB) intake with nurse and New OB (NOB) labs- [redacted] weeks gestation  New OB (NOB) physical examination with provider- 11/[redacted] weeks gestation  Flu vaccine-[redacted] weeks gestation  Anatomy scan-[redacted] weeks gestation  Glucose tolerance test, blood work to test for anemia, T-dap vaccine-[redacted] weeks gestation  Vaginal swabs/cultures-STD/Group B strep-[redacted] weeks gestation  Appointments every 4 weeks until 28 weeks  Every 2 weeks from 28 weeks until 36 weeks  Weekly visits from 36 weeks until delivery  Morning Sickness Morning sickness is when you feel sick to your stomach (nauseous) during pregnancy. You may feel sick to your stomach and throw up (vomit). You may feel sick in the morning, but you can feel this way any time of day. Some women feel very sick to their stomach and cannot stop throwing up (hyperemesis gravidarum). Follow these instructions at home:  Only take medicines as told by your doctor.  Take multivitamins as told by your doctor. Taking multivitamins before getting pregnant can stop or lessen the harshness of morning sickness.  Eat dry toast or unsalted crackers before getting out of bed.  Eat 5 to 6 small meals a day.  Eat dry and bland foods like rice and baked potatoes.  Do not drink liquids with meals. Drink between meals.  Do not eat greasy, fatty, or spicy foods.  Have someone cook for you if the smell of food causes you to feel sick or throw up.  If you feel sick to your stomach after taking prenatal vitamins, take them at night or with a snack.  Eat protein when you need a snack (nuts, yogurt, cheese).  Eat unsweetened gelatins for dessert.  Wear a bracelet used for sea sickness (acupressure wristband).  Go to a doctor that puts thin needles into certain body points (acupuncture) to improve how you feel.  Do not smoke.  Use a  humidifier to keep the air in your house free of odors.  Get lots of fresh air. Contact a doctor if:  You need medicine to feel better.  You feel dizzy or lightheaded.  You are losing weight. Get help right away if:  You feel very sick to your stomach and cannot stop throwing up.  You pass out (faint). This information is not intended to replace advice given to you by your health care provider. Make sure you discuss any questions you have with your health care provider. Document Released: 09/24/2004 Document Revised: 01/23/2016 Document Reviewed: 02/01/2013 Elsevier Interactive Patient Education  2017 [place] of Pregnancy The first trimester of pregnancy is from week 1 until the end of week 13 (months 1 through 3). A week after a sperm fertilizes an egg, the egg will implant on the wall of the uterus. This embryo will begin to develop into a baby. Genes from you and your partner will form the baby. The female genes will determine whether the baby will be a boy or a girl. At 6-8 weeks, the eyes and face will be formed, and the heartbeat can be seen on ultrasound. At the end of 12 weeks, all the baby's organs will be formed. Now that you are pregnant, you will want to do everything you can to have a healthy baby. Two of the most important things are to get good prenatal care and to follow your health care provider's instructions. Prenatal care is all the medical care you  receive before the baby's birth. This care will help prevent, find, and treat any problems during the pregnancy and childbirth. Body changes during your first trimester Your body goes through many changes during pregnancy. The changes vary from woman to woman.  You may gain or lose a couple of pounds at first.  You may feel sick to your stomach (nauseous) and you may throw up (vomit). If the vomiting is uncontrollable, call your health care provider.  You may tire easily.  You may develop headaches  that can be relieved by medicines. All medicines should be approved by your health care provider.  You may urinate more often. Painful urination may mean you have a bladder infection.  You may develop heartburn as a result of your pregnancy.  You may develop constipation because certain hormones are causing the muscles that push stool through your intestines to slow down.  You may develop hemorrhoids or swollen veins (varicose veins).  Your breasts may begin to grow larger and become tender. Your nipples may stick out more, and the tissue that surrounds them (areola) may become darker.  Your gums may bleed and may be sensitive to brushing and flossing.  Dark spots or blotches (chloasma, mask of pregnancy) may develop on your face. This will likely fade after the baby is born.  Your menstrual periods will stop.  You may have a loss of appetite.  You may develop cravings for certain kinds of food.  You may have changes in your emotions from day to day, such as being excited to be pregnant or being concerned that something may go wrong with the pregnancy and baby.  You may have more vivid and strange dreams.  You may have changes in your hair. These can include thickening of your hair, rapid growth, and changes in texture. Some women also have hair loss during or after pregnancy, or hair that feels dry or thin. Your hair will most likely return to normal after your baby is born.  What to expect at prenatal visits During a routine prenatal visit:  You will be weighed to make sure you and the baby are growing normally.  Your blood pressure will be taken.  Your abdomen will be measured to track your baby's growth.  The fetal heartbeat will be listened to between weeks 10 and 14 of your pregnancy.  Test results from any previous visits will be discussed.  Your health care provider may ask you:  How you are feeling.  If you are feeling the baby move.  If you have had any  abnormal symptoms, such as leaking fluid, bleeding, severe headaches, or abdominal cramping.  If you are using any tobacco products, including cigarettes, chewing tobacco, and electronic cigarettes.  If you have any questions.  Other tests that may be performed during your first trimester include:  Blood tests to find your blood type and to check for the presence of any previous infections. The tests will also be used to check for low iron levels (anemia) and protein on red blood cells (Rh antibodies). Depending on your risk factors, or if you previously had diabetes during pregnancy, you may have tests to check for high blood sugar that affects pregnant women (gestational diabetes).  Urine tests to check for infections, diabetes, or protein in the urine.  An ultrasound to confirm the proper growth and development of the baby.  Fetal screens for spinal cord problems (spina bifida) and Down syndrome.  HIV (human immunodeficiency virus) testing. Routine prenatal testing  includes screening for HIV, unless you choose not to have this test.  You may need other tests to make sure you and the baby are doing well.  Follow these instructions at home: Medicines  Follow your health care provider's instructions regarding medicine use. Specific medicines may be either safe or unsafe to take during pregnancy.  Take a prenatal vitamin that contains at least 600 micrograms (mcg) of folic acid.  If you develop constipation, try taking a stool softener if your health care provider approves. Eating and drinking  Eat a balanced diet that includes fresh fruits and vegetables, whole grains, good sources of protein such as meat, eggs, or tofu, and low-fat dairy. Your health care provider will help you determine the amount of weight gain that is right for you.  Avoid raw meat and uncooked cheese. These carry germs that can cause birth defects in the baby.  Eating four or five small meals rather than three  large meals a day may help relieve nausea and vomiting. If you start to feel nauseous, eating a few soda crackers can be helpful. Drinking liquids between meals, instead of during meals, also seems to help ease nausea and vomiting.  Limit foods that are high in fat and processed sugars, such as fried and sweet foods.  To prevent constipation: ? Eat foods that are high in fiber, such as fresh fruits and vegetables, whole grains, and beans. ? Drink enough fluid to keep your urine clear or pale yellow. Activity  Exercise only as directed by your health care provider. Most women can continue their usual exercise routine during pregnancy. Try to exercise for 30 minutes at least 5 days a week. Exercising will help you: ? Control your weight. ? Stay in shape. ? Be prepared for labor and delivery.  Experiencing pain or cramping in the lower abdomen or lower back is a good sign that you should stop exercising. Check with your health care provider before continuing with normal exercises.  Try to avoid standing for long periods of time. Move your legs often if you must stand in one place for a long time.  Avoid heavy lifting.  Wear low-heeled shoes and practice good posture.  You may continue to have sex unless your health care provider tells you not to. Relieving pain and discomfort  Wear a good support bra to relieve breast tenderness.  Take warm sitz baths to soothe any pain or discomfort caused by hemorrhoids. Use hemorrhoid cream if your health care provider approves.  Rest with your legs elevated if you have leg cramps or low back pain.  If you develop varicose veins in your legs, wear support hose. Elevate your feet for 15 minutes, 3-4 times a day. Limit salt in your diet. Prenatal care  Schedule your prenatal visits by the twelfth week of pregnancy. They are usually scheduled monthly at first, then more often in the last 2 months before delivery.  Write down your questions. Take them  to your prenatal visits.  Keep all your prenatal visits as told by your health care provider. This is important. Safety  Wear your seat belt at all times when driving.  Make a list of emergency phone numbers, including numbers for family, friends, the hospital, and police and fire departments. General instructions  Ask your health care provider for a referral to a local prenatal education class. Begin classes no later than the beginning of month 6 of your pregnancy.  Ask for help if you have counseling or nutritional  needs during pregnancy. Your health care provider can offer advice or refer you to specialists for help with various needs.  Do not use hot tubs, steam rooms, or saunas.  Do not douche or use tampons or scented sanitary pads.  Do not cross your legs for long periods of time.  Avoid cat litter boxes and soil used by cats. These carry germs that can cause birth defects in the baby and possibly loss of the fetus by miscarriage or stillbirth.  Avoid all smoking, herbs, alcohol, and medicines not prescribed by your health care provider. Chemicals in these products affect the formation and growth of the baby.  Do not use any products that contain nicotine or tobacco, such as cigarettes and e-cigarettes. If you need help quitting, ask your health care provider. You may receive counseling support and other resources to help you quit.  Schedule a dentist appointment. At home, brush your teeth with a soft toothbrush and be gentle when you floss. Contact a health care provider if:  You have dizziness.  You have mild pelvic cramps, pelvic pressure, or nagging pain in the abdominal area.  You have persistent nausea, vomiting, or diarrhea.  You have a bad smelling vaginal discharge.  You have pain when you urinate.  You notice increased swelling in your face, hands, legs, or ankles.  You are exposed to fifth disease or chickenpox.  You are exposed to [place] measles (rubella)  and have never had it. Get help right away if:  You have a fever.  You are leaking fluid from your vagina.  You have spotting or bleeding from your vagina.  You have severe abdominal cramping or pain.  You have rapid weight gain or loss.  You vomit blood or material that looks like coffee grounds.  You develop a severe headache.  You have shortness of breath.  You have any kind of trauma, such as from a fall or a car accident. Summary  The first trimester of pregnancy is from week 1 until the end of week 13 (months 1 through 3).  Your body goes through many changes during pregnancy. The changes vary from woman to woman.  You will have routine prenatal visits. During those visits, your health care provider will examine you, discuss any test results you may have, and talk with you about how you are feeling. This information is not intended to replace advice given to you by your health care provider. Make sure you discuss any questions you have with your health care provider. Document Released: 08/11/2001 Document Revised: 07/29/2016 Document Reviewed: 07/29/2016 Elsevier Interactive Patient Education  2018 [employer]. Commonly Asked Questions During Pregnancy  Cats: A parasite can be excreted in cat feces.  To avoid exposure you need to have another person empty the little box.  If you must empty the litter box you will need to wear gloves.  Wash your hands after handling your cat.  This parasite can also be found in raw or undercooked meat so this should also be avoided.  Colds, Sore Throats, Flu: Please check your medication sheet to see what you can take for symptoms.  If your symptoms are unrelieved by these medications please call the office.  Dental Work: Most any dental work Investment banker, corporate recommends is permitted.  X-rays should only be taken during the first trimester if absolutely necessary.  Your abdomen should be shielded with a lead apron during all x-rays.  Please  notify your provider prior to receiving any x-rays.  Novocaine  is fine; gas is not recommended.  If your dentist requires a note from [place] prior to dental work please call the office and we will provide one for you.  Exercise: Exercise is an important part of staying healthy during your pregnancy.  You may continue most exercises you were accustomed to prior to pregnancy.  Later in your pregnancy you will most likely notice you have difficulty with activities requiring balance like riding a bicycle.  It is important that you listen to your body and avoid activities that put you at a higher risk of falling.  Adequate rest and staying well hydrated are a must!  If you have questions about the safety of specific activities ask your provider.    Exposure to Children with illness: Try to avoid obvious exposure; report any symptoms to [place] when noted,  If you have chicken pos, red measles or mumps, you should be immune to these diseases.   Please do not take any vaccines while pregnant unless you have checked with your OB provider.  Fetal Movement: After 28 weeks we recommend you do "kick counts" twice daily.  Lie or sit down in a calm quiet environment and count your baby movements "kicks".  You should feel your baby at least 10 times per hour.  If you have not felt 10 kicks within the first hour get up, walk around and have something sweet to eat or drink then repeat for an additional hour.  If count remains less than 10 per hour notify your provider.  Fumigating: Follow your pest control agent's advice as to how long to stay out of your home.  Ventilate the area well before re-entering.  Hemorrhoids:   Most over-the-counter preparations can be used during pregnancy.  Check your medication to see what is safe to use.  It is important to use a stool softener or fiber in your diet and to drink lots of liquids.  If hemorrhoids seem to be getting worse please call the office.   Hot Tubs:  Hot tubs Jacuzzis and  saunas are not recommended while pregnant.  These increase your internal body temperature and should be avoided.  Intercourse:  Sexual intercourse is safe during pregnancy as long as you are comfortable, unless otherwise advised by your provider.  Spotting may occur after intercourse; report any bright red bleeding that is heavier than spotting.  Labor:  If you know that you are in labor, please go to the hospital.  If you are unsure, please call the office and let us help you decide what to do.  Lifting, straining, etc:  If your job requires heavy lifting or straining please check with your provider for any limitations.  Generally, you should not lift items heavier than that you can lift simply with your hands and arms (no back muscles)  Painting:  Paint fumes do not harm your pregnancy, but may make you ill and should be avoided if possible.  Latex or water based paints have less odor than oils.  Use adequate ventilation while painting.  Permanents & Hair Color:  Chemicals in hair dyes are not recommended as they cause increase hair dryness which can increase hair loss during pregnancy.  " Highlighting" and permanents are allowed.  Dye may be absorbed differently and permanents may not hold as well during pregnancy.  Sunbathing:  Use a sunscreen, as skin burns easily during pregnancy.  Drink plenty of fluids; avoid over heating.  Tanning Beds:  Because their possible side effects are still  unknown, tanning beds are not recommended.  Ultrasound Scans:  Routine ultrasounds are performed at approximately 20 weeks.  You will be able to see your baby's general anatomy an if you would like to know the gender this can usually be determined as well.  If it is questionable when you conceived you may also receive an ultrasound early in your pregnancy for dating purposes.  Otherwise ultrasound exams are not routinely performed unless there is a medical necessity.  Although you can request a scan we ask that  you pay for it when conducted because insurance does not cover " patient request" scans.  Work: If your pregnancy proceeds without complications you may work until your due date, unless your physician or employer advises otherwise.  Round Ligament Pain/Pelvic Discomfort:  Sharp, shooting pains not associated with bleeding are fairly common, usually occurring in the second trimester of pregnancy.  They tend to be worse when standing up or when you remain standing for long periods of time.  These are the result of pressure of certain pelvic ligaments called "round ligaments".  Rest, Tylenol and heat seem to be the most effective relief.  As the womb and fetus grow, they rise out of the pelvis and the discomfort improves.  Please notify the office if your pain seems different than that described.  It may represent a more serious condition.  Common Medications Safe in Pregnancy  Acne:      Constipation:  Benzoyl Peroxide     Colace  Clindamycin      Dulcolax Suppository  Topica Erythromycin     Fibercon  Salicylic Acid      Metamucil         Miralax AVOID:        Senakot   Accutane    Cough:  Retin-A       Cough Drops  Tetracycline      Phenergan w/ Codeine if Rx  Minocycline      Robitussin (Plain & DM)  Antibiotics:     Crabs/Lice:  Ceclor       RID  Cephalosporins    AVOID:  E-Mycins      Kwell  Keflex  Macrobid/Macrodantin   Diarrhea:  Penicillin      Kao-Pectate  Zithromax      Imodium AD         PUSH FLUIDS AVOID:       Cipro     Fever:  Tetracycline      Tylenol (Regular or Extra  Minocycline       Strength)  Levaquin      Extra Strength-Do not          Exceed 8 tabs/24 hrs Caffeine:        <270m/day (equiv. To 1 cup of coffee or  approx. 3 12 oz sodas)         Gas: Cold/Hayfever:       Gas-X  Benadryl      Mylicon  Claritin       Phazyme  **Claritin-D        Chlor-Trimeton    Headaches:  Dimetapp      ASA-Free Excedrin  Drixoral-Non-Drowsy     Cold Compress  Mucinex  (Guaifenasin)     Tylenol (Regular or Extra  Sudafed/Sudafed-12 Hour     Strength)  **Sudafed PE Pseudoephedrine   Tylenol Cold & Sinus     Vicks Vapor Rub  Zyrtec  **AVOID if Problems With Blood Pressure  Heartburn: Avoid lying down for at least 1 hour after meals  Aciphex      Maalox     Rash:  Milk of Magnesia     Benadryl    Mylanta       1% Hydrocortisone Cream  Pepcid  Pepcid Complete   Sleep Aids:  Prevacid      Ambien   Prilosec       Benadryl  Rolaids       Chamomile Tea  Tums (Limit 4/day)     Unisom  Zantac       Tylenol PM         Warm milk-add vanilla or  Hemorrhoids:       Sugar for taste  Anusol/Anusol H.C.  (RX: Analapram 2.5%)  Sugar Substitutes:  Hydrocortisone OTC     Ok in moderation  Preparation H      Tucks        Vaseline lotion applied to tissue with wiping    Herpes:     Throat:  Acyclovir      Oragel  Famvir  Valtrex     Vaccines:         Flu Shot Leg Cramps:       *Gardasil  Benadryl      Hepatitis A         Hepatitis B Nasal Spray:       Pneumovax  Saline Nasal Spray     Polio Booster         Tetanus Nausea:       Tuberculosis test or PPD  Vitamin B6 25 mg TID   AVOID:    Dramamine      *Gardasil  Emetrol       Live Poliovirus  Ginger Root 250 mg QID    MMR (measles, mumps &  High Complex Carbs @ Bedtime    rebella)  Sea Bands-Accupressure    Varicella (Chickenpox)  Unisom 1/2 tab TID     *No known complications           If received before Pain:         Known pregnancy;   Darvocet       Resume series after  Lortab        Delivery  Percocet    Yeast:   Tramadol      Femstat  Tylenol 3      Gyne-lotrimin  Ultram       Monistat  Vicodin           MISC:         All Sunscreens           Hair Coloring/highlights          Insect Repellant's          (Including DEET)         Mystic Tans

## 2017-11-23 LAB — DRUG PROFILE, UR, 9 DRUGS (LABCORP)
AMPHETAMINES, URINE: NEGATIVE ng/mL
BARBITURATE QUANT UR: NEGATIVE ng/mL
BENZODIAZEPINE QUANT UR: NEGATIVE ng/mL
CANNABINOID QUANT UR: NEGATIVE ng/mL
Cocaine (Metab.): NEGATIVE ng/mL
Methadone Screen, Urine: NEGATIVE ng/mL
Opiate Quant, Ur: NEGATIVE ng/mL
PCP Quant, Ur: NEGATIVE ng/mL
Propoxyphene: NEGATIVE ng/mL

## 2017-11-23 LAB — URINALYSIS, ROUTINE W REFLEX MICROSCOPIC
BILIRUBIN UA: NEGATIVE
Glucose, UA: NEGATIVE
KETONES UA: NEGATIVE
Leukocytes, UA: NEGATIVE
Nitrite, UA: NEGATIVE
Protein, UA: NEGATIVE
RBC UA: NEGATIVE
SPEC GRAV UA: 1.015 (ref 1.005–1.030)
UUROB: 0.2 mg/dL (ref 0.2–1.0)
pH, UA: 8 — ABNORMAL HIGH (ref 5.0–7.5)

## 2017-11-24 LAB — CULTURE, OB URINE

## 2017-11-24 LAB — GC/CHLAMYDIA PROBE AMP
Chlamydia trachomatis, NAA: NEGATIVE
Neisseria gonorrhoeae by PCR: NEGATIVE

## 2017-11-24 LAB — URINE CULTURE, OB REFLEX: Organism ID, Bacteria: NO GROWTH

## 2017-11-25 LAB — HIV ANTIBODY (ROUTINE TESTING W REFLEX): HIV SCREEN 4TH GENERATION: NONREACTIVE

## 2017-11-25 LAB — CBC WITH DIFFERENTIAL/PLATELET
BASOS ABS: 0 10*3/uL (ref 0.0–0.2)
Basos: 0 %
EOS (ABSOLUTE): 0.2 10*3/uL (ref 0.0–0.4)
Eos: 2 %
HEMOGLOBIN: 12.1 g/dL (ref 11.1–15.9)
Hematocrit: 36 % (ref 34.0–46.6)
IMMATURE GRANS (ABS): 0 10*3/uL (ref 0.0–0.1)
IMMATURE GRANULOCYTES: 0 %
LYMPHS: 26 %
Lymphocytes Absolute: 2.6 10*3/uL (ref 0.7–3.1)
MCH: 29.3 pg (ref 26.6–33.0)
MCHC: 33.6 g/dL (ref 31.5–35.7)
MCV: 87 fL (ref 79–97)
MONOCYTES: 6 %
Monocytes Absolute: 0.6 10*3/uL (ref 0.1–0.9)
NEUTROS PCT: 66 %
Neutrophils Absolute: 6.7 10*3/uL (ref 1.4–7.0)
PLATELETS: 334 10*3/uL (ref 150–379)
RBC: 4.13 x10E6/uL (ref 3.77–5.28)
RDW: 13.2 % (ref 12.3–15.4)
WBC: 10.1 10*3/uL (ref 3.4–10.8)

## 2017-11-25 LAB — NICOTINE/COTININE METABOLITES
Cotinine: NOT DETECTED ng/mL
Nicotine: NOT DETECTED ng/mL

## 2017-11-25 LAB — ABO AND RH: RH TYPE: POSITIVE

## 2017-11-25 LAB — ANTIBODY SCREEN: ANTIBODY SCREEN: NEGATIVE

## 2017-11-25 LAB — VARICELLA ZOSTER ANTIBODY, IGG: Varicella zoster IgG: 275 index (ref >165)

## 2017-11-25 LAB — RPR: RPR Ser Ql: NONREACTIVE

## 2017-11-25 LAB — RUBELLA SCREEN: Rubella Antibodies, IGG: <0.9 index — ABNORMAL LOW (ref >0.99)

## 2017-11-25 LAB — HEPATITIS B SURFACE ANTIGEN: HEP B S AG: NEGATIVE

## 2017-11-30 ENCOUNTER — Encounter: Payer: Self-pay | Admitting: Certified Nurse Midwife

## 2017-12-06 ENCOUNTER — Ambulatory Visit (INDEPENDENT_AMBULATORY_CARE_PROVIDER_SITE_OTHER): Payer: 59 | Admitting: Certified Nurse Midwife

## 2017-12-06 ENCOUNTER — Encounter: Payer: Self-pay | Admitting: Certified Nurse Midwife

## 2017-12-06 VITALS — BP 115/70 | HR 65 | Wt 180.2 lb

## 2017-12-06 DIAGNOSIS — Z3401 Encounter for supervision of normal first pregnancy, first trimester: Secondary | ICD-10-CM

## 2017-12-06 LAB — POCT URINALYSIS DIPSTICK
Bilirubin, UA: NEGATIVE
Blood, UA: NEGATIVE
Glucose, UA: NEGATIVE
KETONES UA: NEGATIVE
Leukocytes, UA: NEGATIVE
NITRITE UA: NEGATIVE
PROTEIN UA: NEGATIVE
SPEC GRAV UA: 1.01 (ref 1.010–1.025)
UROBILINOGEN UA: 0.2 U/dL
pH, UA: 6.5 (ref 5.0–8.0)

## 2017-12-06 MED ORDER — ONDANSETRON HCL 4 MG PO TABS: 4.0000 mg | ORAL_TABLET | Freq: Three times a day (TID) | ORAL | 0 refills | Status: DC | PRN

## 2017-12-06 NOTE — Progress Notes (Signed)
Pt is here for a new OB visit. Still having vomiting despite Diclegis. Declined genetic testing.

## 2017-12-06 NOTE — Progress Notes (Signed)
NEW OB HISTORY AND PHYSICAL  SUBJECTIVE:       Paulo FruitDanielle Andy is a 27 y.o. G1P0 female, Patient's last menstrual period was 09/14/2017 (exact date)., Estimated Date of Delivery: 06/21/18, [id number], presents today for establishment of Prenatal Care. She has no unusual complaints and complains of nausea with vomiting.  days      Gynecologic History Patient's last menstrual period was 09/14/2017 (exact date). Normal Contraception: none Last Pap: 4-5 yrs ago. Results were: normal per pt.   Obstetric History OB History  Gravida Para Term Preterm AB Living  1            SAB TAB Ectopic Multiple Live Births               # Outcome Date GA Lbr Len/2nd Weight Sex Delivery Anes PTL Lv  1 Current             Past Medical History:  Diagnosis Date  . Hypothyroid   . Medical history non-contributory   . PCOS (polycystic ovarian syndrome)   . PCOS (polycystic ovarian syndrome)     Past Surgical History:  Procedure Laterality Date  . APPENDECTOMY      Current Outpatient Medications on File Prior to Visit  Medication Sig Dispense Refill  . Doxylamine-Pyridoxine 10-10 MG TBEC Take 1 tablet by mouth 3 (three) times daily. 120 tablet 1  . Prenatal Vit-Fe Fumarate-FA (PRENATAL MULTIVITAMIN) TABS tablet Take 1 tablet by mouth daily at 12 noon.     No current facility-administered medications on file prior to visit.     Allergies  Allergen Reactions  . Amoxicillin     childhood  . Methocarbamol Other (See Comments)    Nausea, anxiety, chills  . Muscle Relief [Capsaicin] Nausea And Vomiting  . Penicillins Other (See Comments)    Childhood     Social History   Socioeconomic History  . Marital status: Single    Spouse name: Not on file  . Number of children: Not on file  . Years of education: Not on file  . Highest education level: Not on file  Occupational History  . Not on file  Social Needs  . Financial resource strain: Not on file  . Food insecurity:    Worry: Not on file     Inability: Not on file  . Transportation needs:    Medical: Not on file    Non-medical: Not on file  Tobacco Use  . Smoking status: Former Smoker    Packs/day: 0.25    Years: 2.00    Pack years: 0.50    Types: Cigarettes    Last attempt to quit: 02/03/2016    Years since quitting: 1.8  . Smokeless tobacco: Never Used  Substance and Sexual Activity  . Alcohol use: Yes    Comment: on occasion  . Drug use: No  . Sexual activity: Yes    Birth control/protection: None  Lifestyle  . Physical activity:    Days per week: Not on file    Minutes per session: Not on file  . Stress: Not on file  Relationships  . Social connections:    Talks on phone: Not on file    Gets together: Not on file    Attends religious service: Not on file    Active member of club or organization: Not on file    Attends meetings of clubs or organizations: Not on file    Relationship status: Not on file  . Intimate partner violence:  Fear of current or ex partner: Not on file    Emotionally abused: Not on file    Physically abused: Not on file    Forced sexual activity: Not on file  Other Topics Concern  . Not on file  Social History Narrative   Marital status: single     Children: none     Employment: bartender    Family History  Problem Relation Age of Onset  . Cancer Mother        breast  . Diabetes Mother        type 2  . Multiple sclerosis Mother   . Breast cancer Mother   . Ovarian cancer Neg Hx   . Colon cancer Neg Hx     The following portions of the patient's history were reviewed and updated as appropriate: allergies, current medications, past OB history, past medical history, past surgical history, past family history, past social history, and problem list.    OBJECTIVE: Initial Physical Exam (New OB)  GENERAL APPEARANCE: alert, well appearing, in no apparent distress, oriented to person, place and time HEAD: normocephalic, atraumatic MOUTH: mucous membranes moist, pharynx  normal without lesions THYROID: no thyromegaly or masses present BREASTS: no masses noted, no significant tenderness, no palpable axillary nodes, no skin changes LUNGS: clear to auscultation, no wheezes, rales or rhonchi, symmetric air entry HEART: regular rate and rhythm, no murmurs ABDOMEN: obese, fundus soft, nontender 11 weeks size and FHT present EXTREMITIES: no redness or tenderness in the calves or thighs, no edema, no limitation in range of motion, intact peripheral pulses SKIN: normal coloration and turgor, no rashes LYMPH NODES: no adenopathy palpable NEUROLOGIC: alert, oriented, normal speech, no focal findings or movement disorder noted  PELVIC EXAM EXTERNAL GENITALIA: normal appearing vulva with no masses, tenderness or lesions VAGINA: no abnormal discharge or lesions CERVIX: no lesions or cervical motion tenderness, discharge white, no redness and contact bleeding with pap smear UTERUS: gravid and consistent with 11 weeks ADNEXA: no masses palpable and nontender OB EXAM PELVIMETRY: appears adequate RECTUM: exam not indicated  ASSESSMENT: Normal pregnancy  PLAN: New OB counseling: The patient has been given an overview regarding routine prenatal care. Recommendations regarding diet, weight gain 15-25 lbs, and exercise in pregnancy were given. Prenatal testing, optional genetic testing, and ultrasound use in pregnancy were reviewed. Benefits of Breast Feeding were discussed. Ultrasound for hx twins today confirms singleton pregnancy. The patient is encouraged to consider nursing her baby post partum.  Doreene Burke, CNM

## 2017-12-06 NOTE — Addendum Note (Signed)
Addended by: Brooke DareSICK, Arleigh Dicola L on: 12/06/2017 03:35 PM   Modules accepted: Orders

## 2017-12-06 NOTE — Progress Notes (Signed)
Body mass index is 27.8 kg/m.

## 2017-12-06 NOTE — Patient Instructions (Signed)
Prenatal Care WHAT IS PRENATAL CARE? Prenatal care is the process of caring for a pregnant woman before she gives birth. Prenatal care makes sure that she and her baby remain as healthy as possible throughout pregnancy. Prenatal care may be provided by a midwife, family practice health care provider, or a childbirth and pregnancy specialist (obstetrician). Prenatal care may include physical examinations, testing, treatments, and education on nutrition, lifestyle, and social support services. WHY IS PRENATAL CARE SO IMPORTANT? Early and consistent prenatal care increases the chance that you and your baby will remain healthy throughout your pregnancy. This type of care also decreases a baby's risk of being born too early (prematurely), or being born smaller than expected (small for gestational age). Any underlying medical conditions you may have that could pose a risk during your pregnancy are discussed during prenatal care visits. You will also be monitored regularly for any new conditions that may arise during your pregnancy so they can be treated quickly and effectively. WHAT HAPPENS DURING PRENATAL CARE VISITS? Prenatal care visits may include the following: Discussion Tell your health care provider about any new signs or symptoms you have experienced since your last visit. These might include:  Nausea or vomiting.  Increased or decreased level of energy.  Difficulty sleeping.  Back or leg pain.  Weight changes.  Frequent urination.  Shortness of breath with physical activity.  Changes in your skin, such as the development of a rash or itchiness.  Vaginal discharge or bleeding.  Feelings of excitement or nervousness.  Changes in your baby's movements.  You may want to write down any questions or topics you want to discuss with your health care provider and bring them with you to your appointment. Examination During your first prenatal care visit, you will likely have a complete  physical exam. Your health care provider will often examine your vagina, cervix, and the position of your uterus, as well as check your heart, lungs, and other body systems. As your pregnancy progresses, your health care provider will measure the size of your uterus and your baby's position inside your uterus. He or she may also examine you for early signs of labor. Your prenatal visits may also include checking your blood pressure and, after about 10-12 weeks of pregnancy, listening to your baby's heartbeat. Testing Regular testing often includes:  Urinalysis. This checks your urine for glucose, protein, or signs of infection.  Blood count. This checks the levels of white and red blood cells in your body.  Tests for sexually transmitted infections (STIs). Testing for STIs at the beginning of pregnancy is routinely done and is required in many states.  Antibody testing. You will be checked to see if you are immune to certain illnesses, such as rubella, that can affect a developing fetus.  Glucose screen. Around 24-28 weeks of pregnancy, your blood glucose level will be checked for signs of gestational diabetes. Follow-up tests may be recommended.  Group B strep. This is a bacteria that is commonly found inside a woman's vagina. This test will inform your health care provider if you need an antibiotic to reduce the amount of this bacteria in your body prior to labor and childbirth.  Ultrasound. Many pregnant women undergo an ultrasound screening around 18-20 weeks of pregnancy to evaluate the health of the fetus and check for any developmental abnormalities.  HIV (human immunodeficiency virus) testing. Early in your pregnancy, you will be screened for HIV. If you are at high risk for HIV, this test may   be repeated during your third trimester of pregnancy.  You may be offered other testing based on your age, personal or family medical history, or other factors. HOW OFTEN SHOULD I PLAN TO SEE MY  HEALTH CARE PROVIDER FOR PRENATAL CARE? Your prenatal care check-up schedule depends on any medical conditions you have before, or develop during, your pregnancy. If you do not have any underlying medical conditions, you will likely be seen for checkups:  Monthly, during the first 6 months of pregnancy.  Twice a month during months 7 and 8 of pregnancy.  Weekly starting in the 9th month of pregnancy and until delivery.  If you develop signs of early labor or other concerning signs or symptoms, you may need to see your health care provider more often. Ask your health care provider what prenatal care schedule is best for you. WHAT CAN I DO TO KEEP MYSELF AND MY BABY AS HEALTHY AS POSSIBLE DURING MY PREGNANCY?  Take a prenatal vitamin containing 400 micrograms (0.4 mg) of folic acid every day. Your health care provider may also ask you to take additional vitamins such as iodine, vitamin D, iron, copper, and zinc.  Take 1500-2000 mg of calcium daily starting at your 20th week of pregnancy until you deliver your baby.  Make sure you are up to date on your vaccinations. Unless directed otherwise by your health care provider: ? You should receive a tetanus, diphtheria, and pertussis (Tdap) vaccination between the 27th and 36th week of your pregnancy, regardless of when your last Tdap immunization occurred. This helps protect your baby from whooping cough (pertussis) after he or she is born. ? You should receive an annual inactivated influenza vaccine (IIV) to help protect you and your baby from influenza. This can be done at any point during your pregnancy.  Eat a well-rounded diet that includes: ? Fresh fruits and vegetables. ? Lean proteins. ? Calcium-rich foods such as milk, yogurt, hard cheeses, and dark, leafy greens. ? Whole grain breads.  Do noteat seafood high in mercury, including: ? Swordfish. ? Tilefish. ? Shark. ? King mackerel. ? More than 6 oz tuna per week.  Do not  eat: ? Raw or undercooked meats or eggs. ? Unpasteurized foods, such as soft cheeses (brie, blue, or feta), juices, and milks. ? Lunch meats. ? Hot dogs that have not been heated until they are steaming.  Drink enough water to keep your urine clear or pale yellow. For many women, this may be 10 or more 8 oz glasses of water each day. Keeping yourself hydrated helps deliver nutrients to your baby and may prevent the start of pre-term uterine contractions.  Do not use any tobacco products including cigarettes, chewing tobacco, or electronic cigarettes. If you need help quitting, ask your health care provider.  Do not drink beverages containing alcohol. No safe level of alcohol consumption during pregnancy has been determined.  Do not use any illegal drugs. These can harm your developing baby or cause a miscarriage.  Ask your health care provider or pharmacist before taking any prescription or over-the-counter medicines, herbs, or supplements.  Limit your caffeine intake to no more than 200 mg per day.  Exercise. Unless told otherwise by your health care provider, try to get 30 minutes of moderate exercise most days of the week. Do not  do high-impact activities, contact sports, or activities with a high risk of falling, such as horseback riding or downhill skiing.  Get plenty of rest.  Avoid anything that raises your  body temperature, such as hot tubs and saunas.  If you own a cat, do not empty its litter box. Bacteria contained in cat feces can cause an infection called toxoplasmosis. This can result in serious harm to the fetus.  Stay away from chemicals such as insecticides, lead, mercury, and cleaning or paint products that contain solvents.  Do not have any X-rays taken unless medically necessary.  Take a childbirth and breastfeeding preparation class. Ask your health care provider if you need a referral or recommendation.  This information is not intended to replace advice given  to you by your health care provider. Make sure you discuss any questions you have with your health care provider. Document Released: 08/20/2003 Document Revised: 01/20/2016 Document Reviewed: 11/01/2013 Elsevier Interactive Patient Education  2017 [employer] for Pregnant Women While you are pregnant, your body will require additional nutrition to help support your growing baby. It is recommended that you consume:  150 additional calories each day during your first trimester.  300 additional calories each day during your second trimester.  300 additional calories each day during your third trimester.  Eating a healthy, well-balanced diet is very important for your health and for your baby's health. You also have a higher need for some vitamins and minerals, such as folic acid, calcium, iron, and vitamin D. What do I need to know about eating during pregnancy?  Do not try to lose weight or go on a diet during pregnancy.  Choose healthy, nutritious foods. Choose  of a sandwich with a glass of milk instead of a candy bar or a high-calorie sugar-sweetened beverage.  Limit your overall intake of foods that have "empty calories." These are foods that have little nutritional value, such as sweets, desserts, candies, sugar-sweetened beverages, and fried foods.  Eat a variety of foods, especially fruits and vegetables.  Take a prenatal vitamin to help meet the additional needs during pregnancy, specifically for folic acid, iron, calcium, and vitamin D.  Remember to stay active. Ask your health care provider for exercise recommendations that are specific to you.  Practice good food safety and cleanliness, such as washing your hands before you eat and after you prepare raw meat. This helps to prevent foodborne illnesses, such as listeriosis, that can be very dangerous for your baby. Ask your health care provider for more information about listeriosis. What does 150 extra calories  look like? Healthy options for an additional 150 calories each day could be any of the following:  Plain low-fat yogurt (6-8 oz) with  cup of berries.  1 apple with 2 teaspoons of peanut butter.  Cut-up vegetables with  cup of hummus.  Low-fat chocolate milk (8 oz or 1 cup).  1 string cheese with 1 medium orange.   of a peanut butter and jelly sandwich on whole-wheat bread (1 tsp of peanut butter).  For 300 calories, you could eat two of those healthy options each day. What is a healthy amount of weight to gain? The recommended amount of weight for you to gain is based on your pre-pregnancy BMI. If your pre-pregnancy BMI was:  Less than 18 (underweight), you should gain 28-40 lb.  18-24.9 (normal), you should gain 25-35 lb.  25-29.9 (overweight), you should gain 15-25 lb.  Greater than 30 (obese), you should gain 11-20 lb.  What if I am having twins or multiples? Generally, pregnant women who will be having twins or multiples may need to increase their daily calories by 300-600 calories each  day. The recommended range for total weight gain is 25-54 lb, depending on your pre-pregnancy BMI. Talk with your health care provider for specific guidance about additional nutritional needs, weight gain, and exercise during your pregnancy. What foods can I eat? Grains Any grains. Try to choose whole grains, such as whole-wheat bread, oatmeal, or brown rice. Vegetables Any vegetables. Try to eat a variety of colors and types of vegetables to get a full range of vitamins and minerals. Remember to wash your vegetables well before eating. Fruits Any fruits. Try to eat a variety of colors and types of fruit to get a full range of vitamins and minerals. Remember to wash your fruits well before eating. Meats and Other Protein Sources Lean meats, including chicken, [place], fish, and lean cuts of beef, veal, or pork. Make sure that all meats are cooked to "well done." Tofu. Tempeh. Beans. Eggs.  Peanut butter and other nut butters. Seafood, such as shrimp, crab, and lobster. If you choose fish, select types that are higher in omega-3 fatty acids, including salmon, herring, mussels, trout, sardines, and pollock. Make sure that all meats are cooked to food-safe temperatures. Dairy Pasteurized milk and milk alternatives. Pasteurized yogurt and pasteurized cheese. Cottage cheese. Sour cream. Beverages Water. Juices that contain 100% fruit juice or vegetable juice. Caffeine-free teas and decaffeinated coffee. Drinks that contain caffeine are okay to drink, but it is better to avoid caffeine. Keep your total caffeine intake to less than 200 mg each day (12 oz of coffee, tea, or soda) or as directed by your health care provider. Condiments Any pasteurized condiments. Sweets and Desserts Any sweets and desserts. Fats and Oils Any fats and oils. The items listed above may not be a complete list of recommended foods or beverages. Contact your dietitian for more options. What foods are not recommended? Vegetables Unpasteurized (raw) vegetable juices. Fruits Unpasteurized (raw) fruit juices. Meats and Other Protein Sources Cured meats that have nitrates, such as bacon, salami, and hotdogs. Luncheon meats, bologna, or other deli meats (unless they are reheated until they are steaming hot). Refrigerated pate, meat spreads from a meat counter, smoked seafood that is found in the refrigerated section of a store. Raw fish, such as sushi or sashimi. High mercury content fish, such as tilefish, shark, swordfish, and king mackerel. Raw meats, such as tuna or beef tartare. Undercooked meats and poultry. Make sure that all meats are cooked to food-safe temperatures. Dairy Unpasteurized (raw) milk and any foods that have raw milk in them. Soft cheeses, such as feta, queso blanco, queso fresco, Brie, Camembert cheeses, blue-veined cheeses, and Panela cheese (unless it is made with pasteurized milk, which must  be stated on the label). Beverages Alcohol. Sugar-sweetened beverages, such as sodas, teas, or energy drinks. Condiments Homemade fermented foods and drinks, such as pickles, sauerkraut, or kombucha drinks. (Store-bought pasteurized versions of these are okay.) Other Salads that are made in the store, such as ham salad, chicken salad, egg salad, tuna salad, and seafood salad. The items listed above may not be a complete list of foods and beverages to avoid. Contact your dietitian for more information. This information is not intended to replace advice given to you by your health care provider. Make sure you discuss any questions you have with your health care provider. Document Released: 06/01/2014 Document Revised: 01/23/2016 Document Reviewed: 01/30/2014 Elsevier Interactive Patient Education  2018 [employer]. Common Medications Safe in Pregnancy  Acne:      Constipation:  Benzoyl Peroxide  Colace  Clindamycin      Dulcolax Suppository  Topica Erythromycin     Fibercon  Salicylic Acid      Metamucil         Miralax AVOID:        Senakot   Accutane    Cough:  Retin-A       Cough Drops  Tetracycline      Phenergan w/ Codeine if Rx  Minocycline      Robitussin (Plain & DM)  Antibiotics:     Crabs/Lice:  Ceclor       RID  Cephalosporins    AVOID:  E-Mycins      Kwell  Keflex  Macrobid/Macrodantin   Diarrhea:  Penicillin      Kao-Pectate  Zithromax      Imodium AD         PUSH FLUIDS AVOID:       Cipro     Fever:  Tetracycline      Tylenol (Regular or Extra  Minocycline       Strength)  Levaquin      Extra Strength-Do not          Exceed 8 tabs/24 hrs Caffeine:        <251m/day (equiv. To 1 cup of coffee or  approx. 3 12 oz sodas)         Gas: Cold/Hayfever:       Gas-X  Benadryl      Mylicon  Claritin       Phazyme  **Claritin-D        Chlor-Trimeton    Headaches:  Dimetapp      ASA-Free Excedrin  Drixoral-Non-Drowsy     Cold Compress  Mucinex  (Guaifenasin)     Tylenol (Regular or Extra  Sudafed/Sudafed-12 Hour     Strength)  **Sudafed PE Pseudoephedrine   Tylenol Cold & Sinus     Vicks Vapor Rub  Zyrtec  **AVOID if Problems With Blood Pressure         Heartburn: Avoid lying down for at least 1 hour after meals  Aciphex      Maalox     Rash:  Milk of Magnesia     Benadryl    Mylanta       1% Hydrocortisone Cream  Pepcid  Pepcid Complete   Sleep Aids:  Prevacid      Ambien   Prilosec       Benadryl  Rolaids       Chamomile Tea  Tums (Limit 4/day)     Unisom  Zantac       Tylenol PM         Warm milk-add vanilla or  Hemorrhoids:       Sugar for taste  Anusol/Anusol H.C.  (RX: Analapram 2.5%)  Sugar Substitutes:  Hydrocortisone OTC     Ok in moderation  Preparation H      Tucks        Vaseline lotion applied to tissue with wiping    Herpes:     Throat:  Acyclovir      Oragel  Famvir  Valtrex     Vaccines:         Flu Shot Leg Cramps:       *Gardasil  Benadryl      Hepatitis A         Hepatitis B Nasal Spray:       Pneumovax  Saline Nasal Spray     Polio Booster  Tetanus Nausea:       Tuberculosis test or PPD  Vitamin B6 25 mg TID   AVOID:    Dramamine      *Gardasil  Emetrol       Live Poliovirus  Ginger Root 250 mg QID    MMR (measles, mumps &  High Complex Carbs @ Bedtime    rebella)  Sea Bands-Accupressure    Varicella (Chickenpox)  Unisom 1/2 tab TID     *No known complications           If received before Pain:         Known pregnancy;   Darvocet       Resume series after  Lortab        Delivery  Percocet    Yeast:   Tramadol      Femstat  Tylenol 3      Gyne-lotrimin  Ultram       Monistat  Vicodin           MISC:         All Sunscreens           Hair Coloring/highlights          Insect Repellant's          (Including DEET)         Mystic Tans

## 2017-12-08 ENCOUNTER — Encounter (INDEPENDENT_AMBULATORY_CARE_PROVIDER_SITE_OTHER): Payer: Self-pay

## 2017-12-08 LAB — PAP IG, CT-NG, RFX HPV ASCU
Chlamydia, Nuc. Acid Amp: NEGATIVE
GONOCOCCUS BY NUCLEIC ACID AMP: NEGATIVE
PAP SMEAR COMMENT: 0

## 2017-12-15 ENCOUNTER — Telehealth: Payer: Self-pay | Admitting: Certified Nurse Midwife

## 2017-12-15 ENCOUNTER — Encounter: Payer: Self-pay | Admitting: Certified Nurse Midwife

## 2017-12-15 ENCOUNTER — Encounter: Payer: Self-pay | Admitting: Emergency Medicine

## 2017-12-15 ENCOUNTER — Emergency Department
Admission: EM | Admit: 2017-12-15 | Discharge: 2017-12-15 | Disposition: A | Discharge status: Home / Self Care | Payer: 59 | Attending: Emergency Medicine | Admitting: Emergency Medicine

## 2017-12-15 DIAGNOSIS — O99281 Endocrine, nutritional and metabolic diseases complicating pregnancy, first trimester: Secondary | ICD-10-CM | POA: Diagnosis not present

## 2017-12-15 DIAGNOSIS — Z87891 Personal history of nicotine dependence: Secondary | ICD-10-CM | POA: Diagnosis not present

## 2017-12-15 DIAGNOSIS — E039 Hypothyroidism, unspecified: Secondary | ICD-10-CM | POA: Diagnosis not present

## 2017-12-15 DIAGNOSIS — Z3A13 13 weeks gestation of pregnancy: Secondary | ICD-10-CM | POA: Insufficient documentation

## 2017-12-15 DIAGNOSIS — O210 Mild hyperemesis gravidarum: Secondary | ICD-10-CM

## 2017-12-15 DIAGNOSIS — O218 Other vomiting complicating pregnancy: Secondary | ICD-10-CM | POA: Diagnosis present

## 2017-12-15 DIAGNOSIS — Z79899 Other long term (current) drug therapy: Secondary | ICD-10-CM | POA: Diagnosis not present

## 2017-12-15 LAB — COMPREHENSIVE METABOLIC PANEL
ALBUMIN: 4.2 g/dL (ref 3.5–5.0)
ALT: 17 U/L (ref 14–54)
ANION GAP: 6 (ref 5–15)
AST: 22 U/L (ref 15–41)
Alkaline Phosphatase: 33 U/L — ABNORMAL LOW (ref 38–126)
BILIRUBIN TOTAL: 0.3 mg/dL (ref 0.3–1.2)
BUN: 7 mg/dL (ref 6–20)
CHLORIDE: 104 mmol/L (ref 101–111)
CO2: 24 mmol/L (ref 22–32)
Calcium: 9.3 mg/dL (ref 8.9–10.3)
Creatinine, Ser: 0.47 mg/dL (ref 0.44–1.00)
GFR calc Af Amer: >60 mL/min (ref >60)
Glucose, Bld: 81 mg/dL (ref 65–99)
POTASSIUM: 3.5 mmol/L (ref 3.5–5.1)
Sodium: 134 mmol/L — ABNORMAL LOW (ref 135–145)
TOTAL PROTEIN: 8 g/dL (ref 6.5–8.1)

## 2017-12-15 LAB — CBC
HEMATOCRIT: 38.5 % (ref 35.0–47.0)
HEMOGLOBIN: 13 g/dL (ref 12.0–16.0)
MCH: 30 pg (ref 26.0–34.0)
MCHC: 33.9 g/dL (ref 32.0–36.0)
MCV: 88.5 fL (ref 80.0–100.0)
Platelets: 341 10*3/uL (ref 150–440)
RBC: 4.35 MIL/uL (ref 3.80–5.20)
RDW: 13.1 % (ref 11.5–14.5)
WBC: 11.9 10*3/uL — AB (ref 3.6–11.0)

## 2017-12-15 LAB — URINALYSIS, COMPLETE (UACMP) WITH MICROSCOPIC
BILIRUBIN URINE: NEGATIVE
Glucose, UA: NEGATIVE mg/dL
HGB URINE DIPSTICK: NEGATIVE
Ketones, ur: 20 mg/dL — AB
LEUKOCYTES UA: NEGATIVE
NITRITE: NEGATIVE
Protein, ur: NEGATIVE mg/dL
Specific Gravity, Urine: 1.008 (ref 1.005–1.030)
pH: 7 (ref 5.0–8.0)

## 2017-12-15 LAB — HCG, QUANTITATIVE, PREGNANCY: HCG, BETA CHAIN, QUANT, S: 147431 m[IU]/mL — AB (ref <5)

## 2017-12-15 MED ORDER — PROMETHAZINE HCL 25 MG RE SUPP: 25.0000 mg | Freq: Four times a day (QID) | RECTAL | 1 refills | Status: DC | PRN

## 2017-12-15 MED ORDER — METOCLOPRAMIDE HCL 5 MG/ML IJ SOLN
10.0000 mg | Freq: Once | INTRAMUSCULAR | Status: AC
  Administered 2017-12-15: 10 mg via INTRAVENOUS
  Filled 2017-12-15: qty 2

## 2017-12-15 MED ORDER — METOCLOPRAMIDE HCL 10 MG PO TABS: 10.0000 mg | ORAL_TABLET | Freq: Three times a day (TID) | ORAL | 0 refills | Status: DC | PRN

## 2017-12-15 MED ORDER — SODIUM CHLORIDE 0.9 % IV BOLUS
1000.0000 mL | Freq: Once | INTRAVENOUS | Status: AC
  Administered 2017-12-15: 1000 mL via INTRAVENOUS

## 2017-12-15 NOTE — ED Notes (Signed)
Pt signed paper copy of discharge 

## 2017-12-15 NOTE — Telephone Encounter (Signed)
Pt last ate a meal 48 hours ago. She tried yogurt yesterday but was not able to hold it. Today she vomited 6 x and was sent home from her job. She c/o of dry mouth, dizziness, h/a, body aches. She states she feels very dry. She has tried bonjesta, diclegis, and b6/unisom. Will send in phenergan rectally. Pt advised to take small sips of h20, ginger ale, or gatorade. If she feels like eating she is to do a brat diet. Pt aware if she goes 12 hours with out holding liquids she will need to go to the ED for fluids. Asked pt to call me tomorrow with an update.

## 2017-12-15 NOTE — ED Triage Notes (Signed)
FIRST NURSE NOTE-pregnant and vomiting. Ambulatory. NAD

## 2017-12-15 NOTE — Telephone Encounter (Signed)
The patient called and stated that she needs to speak with a nurse if possible. Please advise.  °

## 2017-12-15 NOTE — Discharge Instructions (Addendum)
Please seek medical attention for any high fevers, chest pain, shortness of breath, change in behavior, persistent vomiting, bloody stool or any other new or concerning symptoms.  

## 2017-12-15 NOTE — Telephone Encounter (Signed)
The patient called and stated that she is going to the ED. Please advise.

## 2017-12-15 NOTE — ED Provider Notes (Signed)
[facility] Emergency Department Provider Note   ____________________________________________   I have reviewed the triage vital signs and the nursing notes.   HISTORY  Chief Complaint Nausea   History limited by: Not Limited   HPI Adrienne Alvarado is a 27 y.o. female who presents to the emergency department today because of concern for nausea vomiting and dehydration.  The patient states she is about [redacted] weeks pregnant.  She states for the past 6 weeks she has had severe nausea and vomiting.  He states that she feels significantly dehydrated.  She has felt weak.  Last ate 2 days ago. Has been in contact with her ob/gyn and has tried multiple antiemetics without any relief. She denies any significant abdominal pain but has had some cramping with the vomiting. She denies any fevers.   Per medical record review patient has a history of PCOS.  Past Medical History:  Diagnosis Date  . Hypothyroid   . Medical history non-contributory   . PCOS (polycystic ovarian syndrome)   . PCOS (polycystic ovarian syndrome)     There are no active problems to display for this patient.   Past Surgical History:  Procedure Laterality Date  . APPENDECTOMY      Prior to Admission medications   Medication Sig Start Date End Date Taking? Authorizing Provider  Doxylamine-Pyridoxine 10-10 MG TBEC Take 1 tablet by mouth 3 (three) times daily. 11/22/17  Yes Doreene Burkehompson, Annie, CNM  Prenatal Vit-Fe Fumarate-FA (PRENATAL MULTIVITAMIN) TABS tablet Take 1 tablet by mouth daily at 12 noon.   Yes [provider]  ondansetron (ZOFRAN) 4 MG tablet Take 1 tablet (4 mg total) by mouth every 8 (eight) hours as needed for nausea or vomiting. Patient not taking: Reported on 12/15/2017 12/06/17   Doreene Burkehompson, Annie, CNM  promethazine (PHENERGAN) 25 MG suppository Place 1 suppository (25 mg total) rectally every 6 (six) hours as needed for nausea or vomiting. Patient not taking: Reported on  12/15/2017 12/15/17   Defrancesco, Prentice DockerMartin A, MD    Allergies Amoxicillin; Methocarbamol; Muscle relief [capsaicin]; and Penicillins  Family History  Problem Relation Age of Onset  . Cancer Mother        breast  . Diabetes Mother        type 2  . Multiple sclerosis Mother   . Breast cancer Mother   . Ovarian cancer Neg Hx   . Colon cancer Neg Hx     Social History Social History   Tobacco Use  . Smoking status: Former Smoker    Packs/day: 0.25    Years: 2.00    Pack years: 0.50    Types: Cigarettes    Last attempt to quit: 02/03/2016    Years since quitting: 1.8  . Smokeless tobacco: Never Used  Substance Use Topics  . Alcohol use: Yes    Comment: on occasion  . Drug use: No    Review of Systems Constitutional: No fever/chills Eyes: No visual changes. ENT: No sore throat. Cardiovascular: Denies chest pain. Respiratory: Denies shortness of breath. Gastrointestinal: Positive for abdominal cramping and nausea and vomiting. Genitourinary: Negative for dysuria. Musculoskeletal: Negative for back pain. Skin: Negative for rash. Neurological: Negative for headaches, focal weakness or numbness.  ____________________________________________   PHYSICAL EXAM:  VITAL SIGNS: ED Triage Vitals  Enc Vitals Group     BP 12/15/17 1639 94/60     Pulse Rate 12/15/17 1639 71     Resp 12/15/17 1639 18     Temp 12/15/17 1639 99.3 F (  37.4 C)     Temp src --      SpO2 12/15/17 1639 99 %     Weight 12/15/17 1638 180 lb (81.6 kg)     Height 12/15/17 1638 5\' 5"  (1.651 m)     Head Circumference --      Peak Flow --      Pain Score 12/15/17 1638 6   Constitutional: Alert and oriented. Well appearing and in no distress. Eyes: Conjunctivae are normal.  ENT   Head: Normocephalic and atraumatic.   Nose: No congestion/rhinnorhea.   Mouth/Throat: Mucous membranes are moist.   Neck: No stridor. Hematological/Lymphatic/Immunilogical: No cervical  lymphadenopathy. Cardiovascular: Normal rate, regular rhythm.  No murmurs, rubs, or gallops.  Respiratory: Normal respiratory effort without tachypnea nor retractions. Breath sounds are clear and equal bilaterally. No wheezes/rales/rhonchi. Gastrointestinal: Soft and non tender. No rebound. No guarding.  Genitourinary: Deferred Musculoskeletal: Normal range of motion in all extremities. No lower extremity edema. Neurologic:  Normal speech and language. No gross focal neurologic deficits are appreciated.  Skin:  Skin is warm, dry and intact. No rash noted. Psychiatric: Mood and affect are normal. Speech and behavior are normal. Patient exhibits appropriate insight and judgment.  ____________________________________________    LABS (pertinent positives/negatives)  CMP na 134, k 3.5, cr 0.47 CBC wbc 11.9, hgb 13.0, plt 341 hcg 147,431 UA not consistent with infection  ____________________________________________   EKG  None  ____________________________________________    RADIOLOGY  None  ____________________________________________   PROCEDURES  Procedures  ____________________________________________   INITIAL IMPRESSION / ASSESSMENT AND PLAN / ED COURSE  Pertinent labs & imaging results that were available during my care of the patient were reviewed by me and considered in my medical decision making (see chart for details).  Patient presented to the emergency department today because of concerns for nausea and vomiting in the setting of pregnancy.  Patient's blood work did not show any significant  electrolyte abnormalities mild hyponatremia.  Very mild leukocytosis.  Patient was given fluids and IV Reglan and was able to tolerate p.o.  Will plan on prescribing Reglan.    ____________________________________________   FINAL CLINICAL IMPRESSION(S) / ED DIAGNOSES  Final diagnoses:  Hyperemesis gravidarum     Note: This dictation was prepared with Dragon  dictation. Any transcriptional errors that result from this process are unintentional     Phineas Semen, MD 12/15/17 2216

## 2017-12-15 NOTE — ED Triage Notes (Signed)
Pt ambulated independently with a steady gait to triage room. Pt arrived with spouse after talking to her OBGYN who instructed patient to come to ED for fluids due to possible dehydration. Pt states she has had uncontrolled nausea and vomiting for the last 6 weeks. Pt is [redacted] weeks pregnant.

## 2017-12-15 NOTE — ED Notes (Signed)
2 unsuccessful attempts at IV insertion

## 2017-12-24 ENCOUNTER — Encounter: Payer: Self-pay | Admitting: Certified Nurse Midwife

## 2017-12-24 ENCOUNTER — Telehealth: Payer: Self-pay

## 2017-12-24 NOTE — Telephone Encounter (Signed)
Letter printed. Mychart message sent.

## 2017-12-28 ENCOUNTER — Other Ambulatory Visit: Payer: Self-pay

## 2017-12-28 ENCOUNTER — Encounter: Payer: Self-pay | Admitting: Certified Nurse Midwife

## 2017-12-28 MED ORDER — ONDANSETRON HCL 4 MG PO TABS: 4.0000 mg | ORAL_TABLET | Freq: Three times a day (TID) | ORAL | 2 refills | Status: DC | PRN

## 2018-01-03 ENCOUNTER — Ambulatory Visit (INDEPENDENT_AMBULATORY_CARE_PROVIDER_SITE_OTHER): Payer: 59 | Admitting: Certified Nurse Midwife

## 2018-01-03 ENCOUNTER — Encounter: Payer: Self-pay | Admitting: Certified Nurse Midwife

## 2018-01-03 VITALS — BP 89/61 | HR 66 | Wt 179.3 lb

## 2018-01-03 DIAGNOSIS — Z3401 Encounter for supervision of normal first pregnancy, first trimester: Secondary | ICD-10-CM

## 2018-01-03 DIAGNOSIS — Z8639 Personal history of other endocrine, nutritional and metabolic disease: Secondary | ICD-10-CM | POA: Diagnosis not present

## 2018-01-03 DIAGNOSIS — Z3689 Encounter for other specified antenatal screening: Secondary | ICD-10-CM

## 2018-01-03 LAB — POCT URINALYSIS DIPSTICK
BILIRUBIN UA: NEGATIVE
Blood, UA: NEGATIVE
GLUCOSE UA: NEGATIVE
KETONES UA: NEGATIVE
Leukocytes, UA: NEGATIVE
Nitrite, UA: NEGATIVE
Protein, UA: NEGATIVE
Spec Grav, UA: 1.01 (ref 1.010–1.025)
Urobilinogen, UA: 0.2 E.U./dL
pH, UA: 8 (ref 5.0–8.0)

## 2018-01-03 NOTE — Patient Instructions (Addendum)
Morning Sickness Morning sickness is when you feel sick to your stomach (nauseous) during pregnancy. You may feel sick to your stomach and throw up (vomit). You may feel sick in the morning, but you can feel this way any time of day. Some women feel very sick to their stomach and cannot stop throwing up (hyperemesis gravidarum). Follow these instructions at home:  Only take medicines as told by your doctor.  Take multivitamins as told by your doctor. Taking multivitamins before getting pregnant can stop or lessen the harshness of morning sickness.  Eat dry toast or unsalted crackers before getting out of bed.  Eat 5 to 6 small meals a day.  Eat dry and bland foods like rice and baked potatoes.  Do not drink liquids with meals. Drink between meals.  Do not eat greasy, fatty, or spicy foods.  Have someone cook for you if the smell of food causes you to feel sick or throw up.  If you feel sick to your stomach after taking prenatal vitamins, take them at night or with a snack.  Eat protein when you need a snack (nuts, yogurt, cheese).  Eat unsweetened gelatins for dessert.  Wear a bracelet used for sea sickness (acupressure wristband).  Go to a doctor that puts thin needles into certain body points (acupuncture) to improve how you feel.  Do not smoke.  Use a humidifier to keep the air in your house free of odors.  Get lots of fresh air. Contact a doctor if:  You need medicine to feel better.  You feel dizzy or lightheaded.  You are losing weight. Get help right away if:  You feel very sick to your stomach and cannot stop throwing up.  You pass out (faint). This information is not intended to replace advice given to you by your health care provider. Make sure you discuss any questions you have with your health care provider. Document Released: 09/24/2004 Document Revised: 01/23/2016 Document Reviewed: 02/01/2013 Elsevier Interactive Patient Education  2017 [place]. Back Pain in Pregnancy Back pain during pregnancy is common. Back pain may be caused by several factors that are related to changes during your pregnancy. Follow these instructions at home: Managing pain, stiffness, and swelling  If directed, apply ice for sudden (acute) back pain. ? Put ice in a plastic bag. ? Place a towel between your skin and the bag. ? Leave the ice on for 20 minutes, 2-3 times per day.  If directed, apply heat to the affected area before you exercise: ? Place a towel between your skin and the heat pack or heating pad. ? Leave the heat on for 20-30 minutes. ? Remove the heat if your skin turns bright red. This is especially important if you are unable to feel pain, heat, or cold. You may have a greater risk of getting burned. Activity  Exercise as told by your health care provider. Exercising is the best way to prevent or manage back pain.  Listen to your body when lifting. If lifting hurts, ask for help or bend your knees. This uses your leg muscles instead of your back muscles.  Squat down when picking up something from the floor. Do not bend over.  Only use bed rest as told by your health care provider. Bed rest should only be used for the most severe episodes of back pain. Standing, Sitting, and Lying Down  Do not stand in one place for long periods of time.  Use good posture when sitting. Make  sure your head rests over your shoulders and is not hanging forward. Use a pillow on your lower back if necessary.  Try sleeping on your side, preferably the left side, with a pillow or two between your legs. If you are sore after a night's rest, your bed may be too soft. A firm mattress may provide more support for your back during pregnancy. General instructions  Do not wear high heels.  Eat a healthy diet. Try to gain weight within your health care provider's recommendations.  Use a maternity girdle, elastic sling, or back brace as told by your health care  provider.  Take over-the-counter and prescription medicines only as told by your health care provider.  Keep all follow-up visits as told by your health care provider. This is important. This includes any visits with any specialists, such as a physical therapist. Contact a health care provider if:  Your back pain interferes with your daily activities.  You have increasing pain in other parts of your body. Get help right away if:  You develop numbness, tingling, weakness, or problems with the use of your arms or legs.  You develop severe back pain that is not controlled with medicine.  You have a sudden change in bowel or bladder control.  You develop shortness of breath, dizziness, or you faint.  You develop nausea, vomiting, or sweating.  You have back pain that is a rhythmic, cramping pain similar to labor pains. Labor pain is usually 1-2 minutes apart, lasts for about 1 minute, and involves a bearing down feeling or pressure in your pelvis.  You have back pain and your water breaks or you have vaginal bleeding.  You have back pain or numbness that travels down your leg.  Your back pain developed after you fell.  You develop pain on one side of your back.  You see blood in your urine.  You develop skin blisters in the area of your back pain. This information is not intended to replace advice given to you by your health care provider. Make sure you discuss any questions you have with your health care provider. Document Released: 11/25/2005 Document Revised: 01/23/2016 Document Reviewed: 05/01/2015 Elsevier Interactive Patient Education  2018 [employer]. Round Ligament Pain The round ligament is a cord of muscle and tissue that helps to support the uterus. It can become a source of pain during pregnancy if it becomes stretched or twisted as the baby grows. The pain usually begins in the second trimester of pregnancy, and it can come and go until the baby is delivered. It is  not a serious problem, and it does not cause harm to the baby. Round ligament pain is usually a short, sharp, and pinching pain, but it can also be a dull, lingering, and aching pain. The pain is felt in the lower side of the abdomen or in the groin. It usually starts deep in the groin and moves up to the outside of the hip area. Pain can occur with:  A sudden change in position.  Rolling over in bed.  Coughing or sneezing.  Physical activity.  Follow these instructions at home: Watch your condition for any changes. Take these steps to help with your pain:  When the pain starts, relax. Then try: ? Sitting down. ? Flexing your knees up to your abdomen. ? Lying on your side with one pillow under your abdomen and another pillow between your legs. ? Sitting in a warm bath for 15-20 minutes or until the  pain goes away.  Take over-the-counter and prescription medicines only as told by your health care provider.  Move slowly when you sit and stand.  Avoid long walks if they cause pain.  Stop or lessen your physical activities if they cause pain.  Contact a health care provider if:  Your pain does not go away with treatment.  You feel pain in your back that you did not have before.  Your medicine is not helping. Get help right away if:  You develop a fever or chills.  You develop uterine contractions.  You develop vaginal bleeding.  You develop nausea or vomiting.  You develop diarrhea.  You have pain when you urinate. This information is not intended to replace advice given to you by your health care provider. Make sure you discuss any questions you have with your health care provider. Document Released: 05/26/2008 Document Revised: 01/23/2016 Document Reviewed: 10/24/2014 Elsevier Interactive Patient Education  2018 [place] of Pregnancy The second trimester is from week 13 through week 28, month 4 through 6. This is often the time in pregnancy  that you feel your best. Often times, morning sickness has lessened or quit. You may have more energy, and you may get hungry more often. Your unborn baby (fetus) is growing rapidly. At the end of the sixth month, he or she is about 9 inches long and weighs about 1 pounds. You will likely feel the baby move (quickening) between 18 and 20 weeks of pregnancy. Follow these instructions at home:  Avoid all smoking, herbs, and alcohol. Avoid drugs not approved by your doctor.  Do not use any tobacco products, including cigarettes, chewing tobacco, and electronic cigarettes. If you need help quitting, ask your doctor. You may get counseling or other support to help you quit.  Only take medicine as told by your doctor. Some medicines are safe and some are not during pregnancy.  Exercise only as told by your doctor. Stop exercising if you start having cramps.  Eat regular, healthy meals.  Wear a good support bra if your breasts are tender.  Do not use hot tubs, steam rooms, or saunas.  Wear your seat belt when driving.  Avoid raw meat, uncooked cheese, and liter boxes and soil used by cats.  Take your prenatal vitamins.  Take 1500-2000 milligrams of calcium daily starting at the 20th week of pregnancy until you deliver your baby.  Try taking medicine that helps you poop (stool softener) as needed, and if your doctor approves. Eat more fiber by eating fresh fruit, vegetables, and whole grains. Drink enough fluids to keep your pee (urine) clear or pale yellow.  Take warm water baths (sitz baths) to soothe pain or discomfort caused by hemorrhoids. Use hemorrhoid cream if your doctor approves.  If you have puffy, bulging veins (varicose veins), wear support hose. Raise (elevate) your feet for 15 minutes, 3-4 times a day. Limit salt in your diet.  Avoid heavy lifting, wear low heals, and sit up straight.  Rest with your legs raised if you have leg cramps or low back pain.  Visit your dentist  if you have not gone during your pregnancy. Use a soft toothbrush to brush your teeth. Be gentle when you floss.  You can have sex (intercourse) unless your doctor tells you not to.  Go to your doctor visits. Get help if:  You feel dizzy.  You have mild cramps or pressure in your lower belly (abdomen).  You have a nagging pain  in your belly area.  You continue to feel sick to your stomach (nauseous), throw up (vomit), or have watery poop (diarrhea).  You have bad smelling fluid coming from your vagina.  You have pain with peeing (urination). Get help right away if:  You have a fever.  You are leaking fluid from your vagina.  You have spotting or bleeding from your vagina.  You have severe belly cramping or pain.  You lose or gain weight rapidly.  You have trouble catching your breath and have chest pain.  You notice sudden or extreme puffiness (swelling) of your face, hands, ankles, feet, or legs.  You have not felt the baby move in over an hour.  You have severe headaches that do not go away with medicine.  You have vision changes. This information is not intended to replace advice given to you by your health care provider. Make sure you discuss any questions you have with your health care provider. Document Released: 11/11/2009 Document Revised: 01/23/2016 Document Reviewed: 10/18/2012 Elsevier Interactive Patient Education  2017 [place]. Common Medications Safe in Pregnancy  Acne:      Constipation:  Benzoyl Peroxide     Colace  Clindamycin      Dulcolax Suppository  Topica Erythromycin     Fibercon  Salicylic Acid      Metamucil         Miralax AVOID:        Senakot   Accutane    Cough:  Retin-A       Cough Drops  Tetracycline      Phenergan w/ Codeine if Rx  Minocycline      Robitussin (Plain &  DM)  Antibiotics:     Crabs/Lice:  Ceclor       RID  Cephalosporins    AVOID:  E-Mycins      Kwell  Keflex  Macrobid/Macrodantin   Diarrhea:  Penicillin      Kao-Pectate  Zithromax      Imodium AD         PUSH FLUIDS AVOID:       Cipro     Fever:  Tetracycline      Tylenol (Regular or Extra  Minocycline       Strength)  Levaquin      Extra Strength-Do not          Exceed 8 tabs/24 hrs Caffeine:        '200mg'$ /day (equiv. To 1 cup of coffee or  approx. 3 12 oz sodas)         Gas: Cold/Hayfever:       Gas-X  Benadryl      Mylicon  Claritin       Phazyme  **Claritin-D        Chlor-Trimeton    Headaches:  Dimetapp      ASA-Free Excedrin  Drixoral-Non-Drowsy     Cold Compress  Mucinex (Guaifenasin)     Tylenol (Regular or Extra  Sudafed/Sudafed-12 Hour     Strength)  **Sudafed PE Pseudoephedrine   Tylenol Cold & Sinus     Vicks Vapor Rub  Zyrtec  **AVOID if Problems With Blood Pressure         Heartburn: Avoid lying down for at least 1 hour after meals  Aciphex      Maalox     Rash:  Milk of Magnesia     Benadryl    Mylanta       1% Hydrocortisone Cream  Pepcid  Pepcid Complete  Sleep Aids:  Prevacid      Ambien   Prilosec       Benadryl  Rolaids       Chamomile Tea  Tums (Limit 4/day)     Unisom  Zantac       Tylenol PM         Warm milk-add vanilla or  Hemorrhoids:       Sugar for taste  Anusol/Anusol H.C.  (RX: Analapram 2.5%)  Sugar Substitutes:  Hydrocortisone OTC     Ok in moderation  Preparation H      Tucks        Vaseline lotion applied to tissue with wiping    Herpes:     Throat:  Acyclovir      Oragel  Famvir  Valtrex     Vaccines:         Flu Shot Leg Cramps:       *Gardasil  Benadryl      Hepatitis A         Hepatitis B Nasal Spray:       Pneumovax  Saline Nasal Spray     Polio Booster         Tetanus Nausea:       Tuberculosis test or PPD  Vitamin B6 25 mg TID   AVOID:    Dramamine      *Gardasil  Emetrol       Live  Poliovirus  Ginger Root 250 mg QID    MMR (measles, mumps &  High Complex Carbs @ Bedtime    rebella)  Sea Bands-Accupressure    Varicella (Chickenpox)  Unisom 1/2 tab TID     *No known complications           If received before Pain:         Known pregnancy;   Darvocet       Resume series after  Lortab        Delivery  Percocet    Yeast:   Tramadol      Femstat  Tylenol 3      Gyne-lotrimin  Ultram       Monistat  Vicodin           MISC:         All Sunscreens           Hair Coloring/highlights          Insect Repellant's          (Including DEET)         Mystic Tans WHAT OB PATIENTS CAN EXPECT   Confirmation of pregnancy and ultrasound ordered if medically indicated-[redacted] weeks gestation  New OB (NOB) intake with nurse and New OB (NOB) labs- [redacted] weeks gestation  New OB (NOB) physical examination with provider- 11/[redacted] weeks gestation  Flu vaccine-[redacted] weeks gestation  Anatomy scan-[redacted] weeks gestation  Glucose tolerance test, blood work to test for anemia, T-dap vaccine-[redacted] weeks gestation  Vaginal swabs/cultures-STD/Group B strep-[redacted] weeks gestation  Appointments every 4 weeks until 28 weeks  Every 2 weeks from 28 weeks until 36 weeks  Weekly visits from 36 weeks until delivery

## 2018-01-03 NOTE — Progress Notes (Signed)
Pt is here for an ROB visit. C/o being unable to keep anything down. Also c/o constipation.

## 2018-01-04 LAB — TSH: TSH: 1.85 u[IU]/mL (ref 0.450–4.500)

## 2018-01-04 NOTE — Progress Notes (Signed)
ROB-Reports nausea with vomiting and constipation. Symptoms better with Zofran and Milk of Magnesium. Discussed home treatment and encourage bowel program. TSH today due to history of hypothyroidism as child. Anticipatory guidance regarding course of prenatal care. Work noted given for last absence due to morning sickness. Encouraged patient to submit intermittent FMLA paperwork to office for completion. Reviewed red flag symptoms and when to call. RTC x 4 weeks for anatomy scan and ROB.

## 2018-01-10 ENCOUNTER — Encounter: Payer: Self-pay | Admitting: Certified Nurse Midwife

## 2018-02-01 ENCOUNTER — Ambulatory Visit (INDEPENDENT_AMBULATORY_CARE_PROVIDER_SITE_OTHER): Payer: 59 | Admitting: Obstetrics and Gynecology

## 2018-02-01 ENCOUNTER — Ambulatory Visit (INDEPENDENT_AMBULATORY_CARE_PROVIDER_SITE_OTHER): Payer: 59

## 2018-02-01 VITALS — BP 94/56 | HR 62 | Wt 182.7 lb

## 2018-02-01 DIAGNOSIS — Z3689 Encounter for other specified antenatal screening: Secondary | ICD-10-CM | POA: Diagnosis not present

## 2018-02-01 DIAGNOSIS — Z3492 Encounter for supervision of normal pregnancy, unspecified, second trimester: Secondary | ICD-10-CM

## 2018-02-01 DIAGNOSIS — Z3401 Encounter for supervision of normal first pregnancy, first trimester: Secondary | ICD-10-CM | POA: Diagnosis not present

## 2018-02-01 LAB — POCT URINALYSIS DIPSTICK
Bilirubin, UA: NEGATIVE
Blood, UA: NEGATIVE
GLUCOSE UA: NEGATIVE
Ketones, UA: NEGATIVE
Leukocytes, UA: NEGATIVE
Nitrite, UA: NEGATIVE
Protein, UA: NEGATIVE
Spec Grav, UA: 1.01 (ref 1.010–1.025)
Urobilinogen, UA: 0.2 E.U./dL
pH, UA: 6 (ref 5.0–8.0)

## 2018-02-01 NOTE — Progress Notes (Signed)
ROB- anatomy scan done today, pt is having L hip pain

## 2018-02-01 NOTE — Patient Instructions (Signed)

## 2018-02-01 NOTE — Progress Notes (Signed)
ROB & Anatomy scan: reports left hip pain for 2-3 weeks ago. Difficulty lifting leg and when sleeping, not painful with palpation. Recommend seeing Dr Mitzi HansenBrugger for adjustment.  Encourage enrolling in classes. Also constipated-tried miralax and MOM, and no relief, not taking PNV, will restart Miralax qod.   Ultrasound reviewed:  Indications: Anatomy Findings:  Singleton intrauterine pregnancy is visualized with FHR at 153 BPM. Biometrics give an (U/S) Gestational age of 28 4/7 weeks and an (U/S) EDD of 06/24/18; this correlates with the clinically established EDD of 06/21/18.  Fetal presentation is vertex.  EFW: 302 grams (0lb 11oz). Placenta: Anterior and grade 1. AFI: WNL subjectively.  Anatomic survey is complete and appears WNL; Gender - Female.   Right Ovary measures 2.6 x 2.1 x 1.5 cm. It is normal in appearance. Left Ovary measures 2.4 x 2.1 x 1.6 cm. It is normal appearance. There is no obvious evidence of a corpus luteal cyst. Survey of the adnexa demonstrates no adnexal masses. There is no free peritoneal fluid in the cul de sac.  Impression: 1. 19 4/7 week Viable Singleton Intrauterine pregnancy by U/S. 2. (U/S) EDD is consistent with Clinically established (LMP) EDD of 06/21/18. 3. Normal Anatomy Scan

## 2018-02-02 DIAGNOSIS — M9904 Segmental and somatic dysfunction of sacral region: Secondary | ICD-10-CM | POA: Diagnosis not present

## 2018-02-02 DIAGNOSIS — M5127 Other intervertebral disc displacement, lumbosacral region: Secondary | ICD-10-CM | POA: Diagnosis not present

## 2018-02-02 DIAGNOSIS — M9903 Segmental and somatic dysfunction of lumbar region: Secondary | ICD-10-CM | POA: Diagnosis not present

## 2018-02-04 DIAGNOSIS — M9903 Segmental and somatic dysfunction of lumbar region: Secondary | ICD-10-CM | POA: Diagnosis not present

## 2018-02-04 DIAGNOSIS — M5127 Other intervertebral disc displacement, lumbosacral region: Secondary | ICD-10-CM | POA: Diagnosis not present

## 2018-02-04 DIAGNOSIS — M9904 Segmental and somatic dysfunction of sacral region: Secondary | ICD-10-CM | POA: Diagnosis not present

## 2018-02-07 DIAGNOSIS — M9904 Segmental and somatic dysfunction of sacral region: Secondary | ICD-10-CM | POA: Diagnosis not present

## 2018-02-07 DIAGNOSIS — M9903 Segmental and somatic dysfunction of lumbar region: Secondary | ICD-10-CM | POA: Diagnosis not present

## 2018-02-07 DIAGNOSIS — M5127 Other intervertebral disc displacement, lumbosacral region: Secondary | ICD-10-CM | POA: Diagnosis not present

## 2018-02-11 DIAGNOSIS — M9904 Segmental and somatic dysfunction of sacral region: Secondary | ICD-10-CM | POA: Diagnosis not present

## 2018-02-11 DIAGNOSIS — M9903 Segmental and somatic dysfunction of lumbar region: Secondary | ICD-10-CM | POA: Diagnosis not present

## 2018-02-11 DIAGNOSIS — M5127 Other intervertebral disc displacement, lumbosacral region: Secondary | ICD-10-CM | POA: Diagnosis not present

## 2018-02-26 ENCOUNTER — Observation Stay
Admission: EM | Admit: 2018-02-26 | Discharge: 2018-02-26 | Disposition: A | Discharge status: Home / Self Care | Payer: 59 | Attending: Certified Nurse Midwife | Admitting: Certified Nurse Midwife

## 2018-02-26 ENCOUNTER — Encounter: Payer: Self-pay | Admitting: Emergency Medicine

## 2018-02-26 ENCOUNTER — Other Ambulatory Visit: Payer: Self-pay

## 2018-02-26 DIAGNOSIS — Z3490 Encounter for supervision of normal pregnancy, unspecified, unspecified trimester: Secondary | ICD-10-CM

## 2018-02-26 DIAGNOSIS — O9989 Other specified diseases and conditions complicating pregnancy, childbirth and the puerperium: Secondary | ICD-10-CM | POA: Diagnosis present

## 2018-02-26 DIAGNOSIS — Z3A23 23 weeks gestation of pregnancy: Secondary | ICD-10-CM | POA: Diagnosis not present

## 2018-02-26 DIAGNOSIS — R197 Diarrhea, unspecified: Secondary | ICD-10-CM | POA: Insufficient documentation

## 2018-02-26 DIAGNOSIS — Z888 Allergy status to other drugs, medicaments and biological substances status: Secondary | ICD-10-CM | POA: Diagnosis not present

## 2018-02-26 DIAGNOSIS — Z880 Allergy status to penicillin: Secondary | ICD-10-CM | POA: Insufficient documentation

## 2018-02-26 DIAGNOSIS — R112 Nausea with vomiting, unspecified: Secondary | ICD-10-CM | POA: Diagnosis not present

## 2018-02-26 HISTORY — DX: Encounter for supervision of normal pregnancy, unspecified, unspecified trimester: Z34.90

## 2018-02-26 LAB — URINALYSIS, ROUTINE W REFLEX MICROSCOPIC
Bilirubin Urine: NEGATIVE
Glucose, UA: NEGATIVE mg/dL
HGB URINE DIPSTICK: NEGATIVE
Ketones, ur: 20 mg/dL — AB
LEUKOCYTES UA: NEGATIVE
Nitrite: NEGATIVE
PROTEIN: NEGATIVE mg/dL
Specific Gravity, Urine: 1.014 (ref 1.005–1.030)
pH: 7 (ref 5.0–8.0)

## 2018-02-26 MED ORDER — LOPERAMIDE HCL 2 MG PO CAPS
4.0000 mg | ORAL_CAPSULE | ORAL | Status: DC | PRN
  Administered 2018-02-26: 4 mg via ORAL
  Filled 2018-02-26 (×2): qty 2

## 2018-02-26 MED ORDER — LOPERAMIDE HCL 2 MG PO CAPS: 4.0000 mg | ORAL_CAPSULE | ORAL | 0 refills | Status: DC | PRN

## 2018-02-26 MED ORDER — PROMETHAZINE HCL 25 MG/ML IJ SOLN
25.0000 mg | Freq: Once | INTRAMUSCULAR | Status: AC
Start: 2018-02-26 — End: 2018-02-26
  Administered 2018-02-26: 25 mg via INTRAMUSCULAR
  Filled 2018-02-26: qty 1

## 2018-02-26 MED ORDER — PROMETHAZINE HCL 25 MG PO TABS: 25.0000 mg | ORAL_TABLET | Freq: Four times a day (QID) | ORAL | 2 refills | Status: DC | PRN

## 2018-02-26 NOTE — ED Triage Notes (Signed)
During triage patient states is not sure if bleeding is vag or rectal. Is not sure if is having contractions. To L and D.

## 2018-02-26 NOTE — ED Triage Notes (Signed)
States diarrhea x 6 today. Lower abd cramping. Cannot states if she has contractions and is not sure if blood is stool or vaginal. L and D contacted and patient to be cleared through L and D first.

## 2018-02-26 NOTE — Discharge Instructions (Signed)
Pt discharged via wheelchair.  Pt still feels nauseated, but has been able to tolerate po fluids (broth, water, ginger ale) and crackers.  Pt given d/c instructions and knows signs/symptoms to report to healthcare provider if needed.

## 2018-02-26 NOTE — Discharge Summary (Signed)
   Obstetric Discharge Summary  Patient ID: Adrienne Alvarado MRN: [id number] DOB/AGE: 1990-11-09 27 y.o.   Date of Admission: 02/26/2018 Serafina RoyalsMichelle Ilia Dimaano, CNM [facility](D. Evans, MD)  Date of Discharge: 02/26/2018 Serafina RoyalsMichelle Kadien Lineman, CNM [facility](D. Evans, MD)  Admitting Diagnosis: Observation at 3036w4d  Secondary Diagnosis: History of hypothyroidism as child     Discharge Diagnosis: No other diagnosis   Antepartum Procedures: Droppler, Continuous tocodynamometer    Brief Hospital Course   L&D OB Triage Note  Adrienne Alvarado is a 27 y.o. G1P0 female at 2436w4d, EDD Estimated Date of Delivery: 06/21/18 who presented to triage for complaints of nausea with vomiting, greater than six (6) episodes of diarrhea and intermittent abdominal cramping since eating [employer] last night.  She was evaluated by the nurses with no significant findings for fetal distress or preterm labor. Vital signs stable. Fetal heart tones within normal limits. She was treated with imodium, phenergan, and oral hydration.   NST INTERPRETATION: Indications: rule out uterine contractions  Mode: External Baseline Rate (A): 155 bpm Contraction Frequency (min): x1   Plan:  She was discharged home with bleeding/labor precautions.  Continue routine prenatal care. Follow up with CNM as previously scheduled.   Discharge Instructions: Per After Visit Summary.  Activity: Refer to After Visit Summary  Diet: Regular  Medications: Allergies as of 02/26/2018      Reactions   Amoxicillin    childhood   Methocarbamol Other (See Comments)   Nausea, anxiety, chills   Muscle Relief [capsaicin] Nausea And Vomiting   Penicillins Other (See Comments)   Has patient had a PCN reaction causing immediate rash, facial/tongue/throat swelling, SOB or lightheadedness with hypotension: Unknown Has patient had a PCN reaction causing severe rash involving mucus membranes or skin necrosis: Unknown Has patient had a PCN reaction that required  hospitalization: Unknown Has patient had a PCN reaction occurring within the last 10 years: Unknown If all of the above answers are "NO", then may proceed with Cephalosporin use. Childhood reaction      Medication List    STOP taking these medications   promethazine 25 MG suppository Commonly known as:  PHENERGAN Replaced by:  promethazine 25 MG tablet     TAKE these medications   loperamide 2 MG capsule Commonly known as:  IMODIUM Take 2 capsules (4 mg total) by mouth as needed for diarrhea or loose stools.   ondansetron 4 MG tablet Commonly known as:  ZOFRAN Take 1 tablet (4 mg total) by mouth every 8 (eight) hours as needed for nausea or vomiting.   prenatal multivitamin Tabs tablet Take 1 tablet by mouth daily at 12 noon.   promethazine 25 MG tablet Commonly known as:  PHENERGAN Take 1 tablet (25 mg total) by mouth every 6 (six) hours as needed for nausea or vomiting. Replaces:  promethazine 25 MG suppository      Outpatient follow up:  Follow-up Information    BancroftShambley, Melodye PedMelody N, CNM. Go on 03/08/2018.   Specialties:  Obstetrics and Gynecology, Radiology Why:  or call as needed Contact information: [address] [place] [address] [phone]           Discharged Condition: stable  Discharged to: home   Gunnar BullaJenkins Michelle Makensie Mulhall, CNM [facility] 02/26/18 7:08 PM

## 2018-02-26 NOTE — OB Triage Note (Signed)
Pt presents to triage with c/o nausea, vomiting and diarrhea since 0330 AM this morning.  Pt reports that the last thing she ate was [employer] (cheeseburger and fries) after work at 2130 pm last night.  Pt denies being in contact with anyone that has been recently ill.  Pt reports +FM and denies LOF.  Pt reports sharp pains that come and go (about every 20 minutes) since the sickness started.  Pt also reports some bleeding (not sure if it is from her rectum or vagina).  No vaginal bleeding noted upon arrival.  Pt given peripad to wear.

## 2018-02-28 ENCOUNTER — Encounter: Payer: Self-pay | Admitting: Certified Nurse Midwife

## 2018-03-02 ENCOUNTER — Encounter: Payer: Self-pay | Admitting: Certified Nurse Midwife

## 2018-03-02 ENCOUNTER — Other Ambulatory Visit: Payer: Self-pay

## 2018-03-02 MED ORDER — ONDANSETRON HCL 4 MG PO TABS: 4.0000 mg | ORAL_TABLET | Freq: Three times a day (TID) | ORAL | 2 refills | Status: DC | PRN

## 2018-03-08 ENCOUNTER — Ambulatory Visit (INDEPENDENT_AMBULATORY_CARE_PROVIDER_SITE_OTHER): Payer: 59 | Admitting: Obstetrics and Gynecology

## 2018-03-08 VITALS — BP 117/59 | HR 77 | Wt 192.8 lb

## 2018-03-08 DIAGNOSIS — Z3492 Encounter for supervision of normal pregnancy, unspecified, second trimester: Secondary | ICD-10-CM | POA: Diagnosis not present

## 2018-03-08 LAB — POCT URINALYSIS DIPSTICK
BILIRUBIN UA: NEGATIVE
Blood, UA: NEGATIVE
GLUCOSE UA: NEGATIVE
KETONES UA: NEGATIVE
Leukocytes, UA: NEGATIVE
Nitrite, UA: NEGATIVE
PH UA: 7.5 (ref 5.0–8.0)
Protein, UA: NEGATIVE
Spec Grav, UA: 1.015 (ref 1.010–1.025)
Urobilinogen, UA: 0.2 E.U./dL

## 2018-03-08 NOTE — Progress Notes (Signed)
ROB-feeling better since seen at ED, taking medications. Stomach hurts when eats solid foods. Enrolled in classes. glucola next visit.

## 2018-03-08 NOTE — Progress Notes (Signed)
ROB- pt is still with nausea, went to ED last weekend

## 2018-03-29 ENCOUNTER — Other Ambulatory Visit: Payer: 59

## 2018-03-29 ENCOUNTER — Ambulatory Visit (INDEPENDENT_AMBULATORY_CARE_PROVIDER_SITE_OTHER): Payer: 59 | Admitting: Certified Nurse Midwife

## 2018-03-29 VITALS — BP 91/67 | HR 86 | Wt 198.2 lb

## 2018-03-29 DIAGNOSIS — Z3492 Encounter for supervision of normal pregnancy, unspecified, second trimester: Secondary | ICD-10-CM | POA: Diagnosis not present

## 2018-03-29 DIAGNOSIS — O99613 Diseases of the digestive system complicating pregnancy, third trimester: Secondary | ICD-10-CM

## 2018-03-29 DIAGNOSIS — K5900 Constipation, unspecified: Secondary | ICD-10-CM

## 2018-03-29 DIAGNOSIS — Z8639 Personal history of other endocrine, nutritional and metabolic disease: Secondary | ICD-10-CM

## 2018-03-29 DIAGNOSIS — Z3A28 28 weeks gestation of pregnancy: Secondary | ICD-10-CM

## 2018-03-29 DIAGNOSIS — M5489 Other dorsalgia: Secondary | ICD-10-CM | POA: Diagnosis not present

## 2018-03-29 LAB — POCT URINALYSIS DIPSTICK
Bilirubin, UA: NEGATIVE
Glucose, UA: NEGATIVE
Ketones, UA: NEGATIVE
LEUKOCYTES UA: NEGATIVE
NITRITE UA: NEGATIVE
PROTEIN UA: NEGATIVE
RBC UA: NEGATIVE
Spec Grav, UA: <=1.005 — AB (ref 1.010–1.025)
UROBILINOGEN UA: 0.2 U/dL
pH, UA: 6.5 (ref 5.0–8.0)

## 2018-03-29 MED ORDER — TETANUS-DIPHTH-ACELL PERTUSSIS 5-2.5-18.5 LF-MCG/0.5 IM SUSP
0.5000 mL | Freq: Once | INTRAMUSCULAR | Status: AC
  Administered 2018-03-29: 0.5 mL via INTRAMUSCULAR

## 2018-03-29 NOTE — Patient Instructions (Addendum)
WHAT OB PATIENTS CAN EXPECT   Confirmation of pregnancy and ultrasound ordered if medically indicated-[redacted] weeks gestation  New OB (NOB) intake with nurse and New OB (NOB) labs- [redacted] weeks gestation  New OB (NOB) physical examination with provider- 11/[redacted] weeks gestation  Flu vaccine-[redacted] weeks gestation  Anatomy scan-[redacted] weeks gestation  Glucose tolerance test, blood work to test for anemia, T-dap vaccine-[redacted] weeks gestation  Vaginal swabs/cultures-STD/Group B strep-[redacted] weeks gestation  Appointments every 4 weeks until 28 weeks  Every 2 weeks from 28 weeks until 36 weeks  Weekly visits from 36 weeks until delivery  Pain Relief During Labor and Delivery Many things can cause pain during labor and delivery, including:  Pressure on bones and ligaments due to the baby moving through the pelvis.  Stretching of tissues due to the baby moving through the birth canal.  Muscle tension due to anxiety or nervousness.  The uterus tightening (contracting) and relaxing to help move the baby.  There are many ways to deal with the pain of labor and delivery. They include:  Taking prenatal classes. Taking these classes helps you know what to expect during your baby's birth. What you learn will increase your confidence and decrease your anxiety.  Practicing relaxation techniques or doing relaxing activities, such as: ? Focused breathing. ? Meditation. ? Visualization. ? Aroma therapy. ? Listening to your favorite music. ? Hypnosis.  Taking a warm shower or bath (hydrotherapy). This may: ? Provide comfort and relaxation. ? Lessen your perception of pain. ? Decrease the amount of pain medicine needed. ? Decrease the length of labor.  Getting a massage or counterpressure on your back.  Applying warm packs or ice packs.  Changing positions often, moving around, or using a birthing ball.  Getting: ? Pain medicine through an IV or injection into a muscle. ? Pain medicine inserted into your  spinal column. ? Injections of sterile water just under the skin on your lower back (intradermal injections). ? Laughing gas (nitrous oxide).  Discuss your pain control options with your health care provider during your prenatal visits. Explore the options offered by your hospital or birth center. What kinds of medicine are available? There are two kinds of medicines that can be used to relieve pain during labor and delivery:  Analgesics. These medicines decrease pain without causing you to lose feeling or the ability to move your muscles.  Anesthetics. These medicines block feeling in the body and can decrease your ability to move freely.  Both of these kinds of medicine can cause minor side effects, such as nausea, trouble concentrating, and sleepiness. They can also decrease the baby's heart rate before birth and affect the baby's breathing rate after birth. For this reason, health care providers are careful about when and how much medicine is given. What are specific medicines and procedures that provide pain relief? Local Anesthetics Local anesthetics are used to numb a small area of the body. They may be used along with another kind of anesthetic or used to numb the nerves of the vagina, cervix, and perineum during the second stage of labor. General Anesthetics General anesthetics cause you to lose consciousness so you do not feel pain. They are usually only used for an emergency cesarean delivery. General anesthetics are given through an IV tube and a mask. Pudendal Block A pudendal block is a form of local anesthetic. It may be used to relieve the pain associated with pushing or stretching of the perineum at the time of delivery or to   further numb the perineum. A pudendal block is done by injecting numbing medicine through the vaginal wall into a nerve in the pelvis. Epidural Analgesia Epidural analgesia is given through a flexible IV catheter that is inserted into the lower back.  Numbing medicine is delivered continuously to the area near your spinal column nerves (epidural space). After having this type of analgesia, you may be able to move your legs but you most likely will not be able to walk. Depending on the amount of medicine given, you may lose all feeling in the lower half of your body, or you may retain some level of sensation, including the urge to push. Epidural analgesia can be used to provide pain relief for a vaginal birth. Spinal Block A spinal block is similar to epidural analgesia, but the medicine is injected into the spinal fluid instead of the epidural space. A spinal block is only given once. It starts to relieve pain quickly, but the pain relief lasts only 1-6 hours. Spinal blocks can be used for cesarean deliveries. Combined Spinal-Epidural (CSE) Block A CSE block combines the effects of a spinal block and epidural analgesia. The spinal block works quickly to block all pain. The epidural analgesia provides continuous pain relief, even after the effects of the spinal block have worn off. This information is not intended to replace advice given to you by your health care provider. Make sure you discuss any questions you have with your health care provider. Document Released: 12/03/2008 Document Revised: 01/24/2016 Document Reviewed: 01/08/2016 Elsevier Interactive Patient Education  2018 [employer]. Breastfeeding Choosing to breastfeed is one of the best decisions you can make for yourself and your baby. A change in hormones during pregnancy causes your breasts to make breast milk in your milk-producing glands. Hormones prevent breast milk from being released before your baby is born. They also prompt milk flow after birth. Once breastfeeding has begun, thoughts of your baby, as well as his or her sucking or crying, can stimulate the release of milk from your milk-producing glands. Benefits of breastfeeding Research shows that breastfeeding offers many  health benefits for infants and mothers. It also offers a cost-free and convenient way to feed your baby. For your baby  Your first milk (colostrum) helps your baby's digestive system to function better.  Special cells in your milk (antibodies) help your baby to fight off infections.  Breastfed babies are less likely to develop asthma, allergies, obesity, or type 2 diabetes. They are also at lower risk for sudden infant death syndrome (SIDS).  Nutrients in breast milk are better able to meet your baby's needs compared to infant formula.  Breast milk improves your baby's brain development. For you  Breastfeeding helps to create a very special bond between you and your baby.  Breastfeeding is convenient. Breast milk costs nothing and is always available at the correct temperature.  Breastfeeding helps to burn calories. It helps you to lose the weight that you gained during pregnancy.  Breastfeeding makes your uterus return faster to its size before pregnancy. It also slows bleeding (lochia) after you give birth.  Breastfeeding helps to lower your risk of developing type 2 diabetes, osteoporosis, rheumatoid arthritis, cardiovascular disease, and breast, ovarian, uterine, and endometrial cancer later in life. Breastfeeding basics Starting breastfeeding  Find a comfortable place to sit or lie down, with your neck and back well-supported.  Place a pillow or a rolled-up blanket under your baby to bring him or her to the level of your breast (  if you are seated). Nursing pillows are specially designed to help support your arms and your baby while you breastfeed.  Make sure that your baby's tummy (abdomen) is facing your abdomen.  Gently massage your breast. With your fingertips, massage from the outer edges of your breast inward toward the nipple. This encourages milk flow. If your milk flows slowly, you may need to continue this action during the feeding.  Support your breast with 4 fingers  underneath and your thumb above your nipple (make the letter "C" with your hand). Make sure your fingers are well away from your nipple and your baby's mouth.  Stroke your baby's lips gently with your finger or nipple.  When your baby's mouth is open wide enough, quickly bring your baby to your breast, placing your entire nipple and as much of the areola as possible into your baby's mouth. The areola is the colored area around your nipple. ? More areola should be visible above your baby's upper lip than below the lower lip. ? Your baby's lips should be opened and extended outward (flanged) to ensure an adequate, comfortable latch. ? Your baby's tongue should be between his or her lower gum and your breast.  Make sure that your baby's mouth is correctly positioned around your nipple (latched). Your baby's lips should create a seal on your breast and be turned out (everted).  It is common for your baby to suck about 2-3 minutes in order to start the flow of breast milk. Latching Teaching your baby how to latch onto your breast properly is very important. An improper latch can cause nipple pain, decreased milk supply, and poor weight gain in your baby. Also, if your baby is not latched onto your nipple properly, he or she may swallow some air during feeding. This can make your baby fussy. Burping your baby when you switch breasts during the feeding can help to get rid of the air. However, teaching your baby to latch on properly is still the best way to prevent fussiness from swallowing air while breastfeeding. Signs that your baby has successfully latched onto your nipple  Silent tugging or silent sucking, without causing you pain. Infant's lips should be extended outward (flanged).  Swallowing heard between every 3-4 sucks once your milk has started to flow (after your let-down milk reflex occurs).  Muscle movement above and in front of his or her ears while sucking.  Signs that your baby has not  successfully latched onto your nipple  Sucking sounds or smacking sounds from your baby while breastfeeding.  Nipple pain.  If you think your baby has not latched on correctly, slip your finger into the corner of your baby's mouth to break the suction and place it between your baby's gums. Attempt to start breastfeeding again. Signs of successful breastfeeding Signs from your baby  Your baby will gradually decrease the number of sucks or will completely stop sucking.  Your baby will fall asleep.  Your baby's body will relax.  Your baby will retain a small amount of milk in his or her mouth.  Your baby will let go of your breast by himself or herself.  Signs from you  Breasts that have increased in firmness, weight, and size 1-3 hours after feeding.  Breasts that are softer immediately after breastfeeding.  Increased milk volume, as well as a change in milk consistency and color by the fifth day of breastfeeding.  Nipples that are not sore, cracked, or bleeding.  Signs that your  baby is getting enough milk  Wetting at least 1-2 diapers during the first 24 hours after birth.  Wetting at least 5-6 diapers every 24 hours for the first week after birth. The urine should be clear or pale yellow by the age of 5 days.  Wetting 6-8 diapers every 24 hours as your baby continues to grow and develop.  At least 3 stools in a 24-hour period by the age of 5 days. The stool should be soft and yellow.  At least 3 stools in a 24-hour period by the age of 7 days. The stool should be seedy and yellow.  No loss of weight greater than 10% of birth weight during the first 3 days of life.  Average weight gain of 4-7 oz (113-198 g) per week after the age of 4 days.  Consistent daily weight gain by the age of 5 days, without weight loss after the age of 2 weeks. After a feeding, your baby may spit up a small amount of milk. This is normal. Breastfeeding frequency and duration Frequent feeding  will help you make more milk and can prevent sore nipples and extremely full breasts (breast engorgement). Breastfeed when you feel the need to reduce the fullness of your breasts or when your baby shows signs of hunger. This is called "breastfeeding on demand." Signs that your baby is hungry include:  Increased alertness, activity, or restlessness.  Movement of the head from side to side.  Opening of the mouth when the corner of the mouth or cheek is stroked (rooting).  Increased sucking sounds, smacking lips, cooing, sighing, or squeaking.  Hand-to-mouth movements and sucking on fingers or hands.  Fussing or crying.  Avoid introducing a pacifier to your baby in the first 4-6 weeks after your baby is born. After this time, you may choose to use a pacifier. Research has shown that pacifier use during the first year of a baby's life decreases the risk of sudden infant death syndrome (SIDS). Allow your baby to feed on each breast as long as he or she wants. When your baby unlatches or falls asleep while feeding from the first breast, offer the second breast. Because newborns are often sleepy in the first few weeks of life, you may need to awaken your baby to get him or her to feed. Breastfeeding times will vary from baby to baby. However, the following rules can serve as a guide to help you make sure that your baby is properly fed:  Newborns (babies 41 weeks of age or younger) may breastfeed every 1-3 hours.  Newborns should not go without breastfeeding for longer than 3 hours during the day or 5 hours during the night.  You should breastfeed your baby a minimum of 8 times in a 24-hour period.  Breast milk pumping Pumping and storing breast milk allows you to make sure that your baby is exclusively fed your breast milk, even at times when you are unable to breastfeed. This is especially important if you go back to work while you are still breastfeeding, or if you are not able to be present  during feedings. Your lactation consultant can help you find a method of pumping that works best for you and give you guidelines about how long it is safe to store breast milk. Caring for your breasts while you breastfeed Nipples can become dry, cracked, and sore while breastfeeding. The following recommendations can help keep your breasts moisturized and healthy:  Avoid using soap on your nipples.  Wear a supportive bra designed especially for nursing. Avoid wearing underwire-style bras or extremely tight bras (sports bras).  Air-dry your nipples for 3-4 minutes after each feeding.  Use only cotton bra pads to absorb leaked breast milk. Leaking of breast milk between feedings is normal.  Use lanolin on your nipples after breastfeeding. Lanolin helps to maintain your skin's normal moisture barrier. Pure lanolin is not harmful (not toxic) to your baby. You may also hand express a few drops of breast milk and gently massage that milk into your nipples and allow the milk to air-dry.  In the first few weeks after giving birth, some women experience breast engorgement. Engorgement can make your breasts feel heavy, warm, and tender to the touch. Engorgement peaks within 3-5 days after you give birth. The following recommendations can help to ease engorgement:  Completely empty your breasts while breastfeeding or pumping. You may want to start by applying warm, moist heat (in the shower or with warm, water-soaked hand towels) just before feeding or pumping. This increases circulation and helps the milk flow. If your baby does not completely empty your breasts while breastfeeding, pump any extra milk after he or she is finished.  Apply ice packs to your breasts immediately after breastfeeding or pumping, unless this is too uncomfortable for you. To do this: ? Put ice in a plastic bag. ? Place a towel between your skin and the bag. ? Leave the ice on for 20 minutes, 2-3 times a day.  Make sure that  your baby is latched on and positioned properly while breastfeeding.  If engorgement persists after 48 hours of following these recommendations, contact your health care provider or a Science writer. Overall health care recommendations while breastfeeding  Eat 3 healthy meals and 3 snacks every day. Well-nourished mothers who are breastfeeding need an additional 450-500 calories a day. You can meet this requirement by increasing the amount of a balanced diet that you eat.  Drink enough water to keep your urine pale yellow or clear.  Rest often, relax, and continue to take your prenatal vitamins to prevent fatigue, stress, and low vitamin and mineral levels in your body (nutrient deficiencies).  Do not use any products that contain nicotine or tobacco, such as cigarettes and e-cigarettes. Your baby may be harmed by chemicals from cigarettes that pass into breast milk and exposure to secondhand smoke. If you need help quitting, ask your health care provider.  Avoid alcohol.  Do not use illegal drugs or marijuana.  Talk with your health care provider before taking any medicines. These include over-the-counter and prescription medicines as well as vitamins and herbal supplements. Some medicines that may be harmful to your baby can pass through breast milk.  It is possible to become pregnant while breastfeeding. If birth control is desired, ask your health care provider about options that will be safe while breastfeeding your baby. Where to find more information: [employer]: www.llli.org Contact a health care provider if:  You feel like you want to stop breastfeeding or have become frustrated with breastfeeding.  Your nipples are cracked or bleeding.  Your breasts are red, tender, or warm.  You have: ? Painful breasts or nipples. ? A swollen area on either breast. ? A fever or chills. ? Nausea or vomiting. ? Drainage other than breast milk from your  nipples.  Your breasts do not become full before feedings by the fifth day after you give birth.  You feel sad and depressed.  Your baby is: ? Too sleepy to eat well. ? Having trouble sleeping. ? More than 28 week old and wetting fewer than 6 diapers in a 24-hour period. ? Not gaining weight by 63 days of age.  Your baby has fewer than 3 stools in a 24-hour period.  Your baby's skin or the white parts of his or her eyes become yellow. Get help right away if:  Your baby is overly tired (lethargic) and does not want to wake up and feed.  Your baby develops an unexplained fever. Summary  Breastfeeding offers many health benefits for infant and mothers.  Try to breastfeed your infant when he or she shows early signs of hunger.  Gently tickle or stroke your baby's lips with your finger or nipple to allow the baby to open his or her mouth. Bring the baby to your breast. Make sure that much of the areola is in your baby's mouth. Offer one side and burp the baby before you offer the other side.  Talk with your health care provider or lactation consultant if you have questions or you face problems as you breastfeed. This information is not intended to replace advice given to you by your health care provider. Make sure you discuss any questions you have with your health care provider. Document Released: 08/17/2005 Document Revised: 09/18/2016 Document Reviewed: 09/18/2016 Elsevier Interactive Patient Education  2018 [employer]. [facility]  [place], [place], [place] [address]  Phone: [phone]   [facility] (second location)  [address] [place], [place] [address]  Phone: [phone]   [facility]) [place], [place], [place] [address] Phone: [phone]   [address] [place], [place] [address]  Phone: [phone] Ligament Pain The round ligament  is a cord of muscle and tissue that helps to support the uterus. It can become a source of pain during pregnancy if it becomes stretched or twisted as the baby grows. The pain usually begins in the second trimester of pregnancy, and it can come and go until the baby is delivered. It is not a serious problem, and it does not cause harm to the baby. Round ligament pain is usually a short, sharp, and pinching pain, but it can also be a dull, lingering, and aching pain. The pain is felt in the lower side of the abdomen or in the groin. It usually starts deep in the groin and moves up to the outside of the hip area. Pain can occur with:  A sudden change in position.  Rolling over in bed.  Coughing or sneezing.  Physical activity.  Follow these instructions at home: Watch your condition for any changes. Take these steps to help with your pain:  When the pain starts, relax. Then try: ? Sitting down. ? Flexing your knees up to your abdomen. ? Lying on your side with one pillow under your abdomen and another pillow between your legs. ? Sitting in a warm bath for 15-20 minutes or until the pain goes away.  Take over-the-counter and prescription medicines only as told by your health care provider.  Move slowly when you sit and stand.  Avoid long walks if they cause pain.  Stop or lessen your physical activities if they cause pain.  Contact a health care provider if:  Your pain does not go away with treatment.  You feel pain in your back that you did not have before.  Your medicine is not helping. Get help right away if:  You develop a fever or chills.  You develop uterine contractions.  You develop vaginal bleeding.  You develop nausea or vomiting.  You develop diarrhea.  You have pain when you urinate. This information is not intended to replace advice given to you by your health care provider. Make sure you discuss any questions you have with your health care  provider. Document Released: 05/26/2008 Document Revised: 01/23/2016 Document Reviewed: 10/24/2014 Elsevier Interactive Patient Education  2018 [employer]. Back Pain in Pregnancy Back pain during pregnancy is common. Back pain may be caused by several factors that are related to changes during your pregnancy. Follow these instructions at home: Managing pain, stiffness, and swelling  If directed, apply ice for sudden (acute) back pain. ? Put ice in a plastic bag. ? Place a towel between your skin and the bag. ? Leave the ice on for 20 minutes, 2-3 times per day.  If directed, apply heat to the affected area before you exercise: ? Place a towel between your skin and the heat pack or heating pad. ? Leave the heat on for 20-30 minutes. ? Remove the heat if your skin turns bright red. This is especially important if you are unable to feel pain, heat, or cold. You may have a greater risk of getting burned. Activity  Exercise as told by your health care provider. Exercising is the best way to prevent or manage back pain.  Listen to your body when lifting. If lifting hurts, ask for help or bend your knees. This uses your leg muscles instead of your back muscles.  Squat down when picking up something from the floor. Do not bend over.  Only use bed rest as told by your health care provider. Bed rest should only be used for the most severe episodes of back pain. Standing, Sitting, and Lying Down  Do not stand in one place for long periods of time.  Use good posture when sitting. Make sure your head rests over your shoulders and is not hanging forward. Use a pillow on your lower back if necessary.  Try sleeping on your side, preferably the left side, with a pillow or two between your legs. If you are sore after a night's rest, your bed may be too soft. A firm mattress may provide more support for your back during pregnancy. General instructions  Do not wear high heels.  Eat a healthy  diet. Try to gain weight within your health care provider's recommendations.  Use a maternity girdle, elastic sling, or back brace as told by your health care provider.  Take over-the-counter and prescription medicines only as told by your health care provider.  Keep all follow-up visits as told by your health care provider. This is important. This includes any visits with any specialists, such as a physical therapist. Contact a health care provider if:  Your back pain interferes with your daily activities.  You have increasing pain in other parts of your body. Get help right away if:  You develop numbness, tingling, weakness, or problems with the use of your arms or legs.  You develop severe back pain that is not controlled with medicine.  You have a sudden change in bowel or bladder control.  You develop shortness of breath, dizziness, or you faint.  You develop nausea, vomiting, or sweating.  You have back pain that is a rhythmic, cramping pain similar to labor pains. Labor pain is usually 1-2 minutes  apart, lasts for about 1 minute, and involves a bearing down feeling or pressure in your pelvis.  You have back pain and your water breaks or you have vaginal bleeding.  You have back pain or numbness that travels down your leg.  Your back pain developed after you fell.  You develop pain on one side of your back.  You see blood in your urine.  You develop skin blisters in the area of your back pain. This information is not intended to replace advice given to you by your health care provider. Make sure you discuss any questions you have with your health care provider. Document Released: 11/25/2005 Document Revised: 01/23/2016 Document Reviewed: 05/01/2015 Elsevier Interactive Patient Education  2018 [employer]. Common Medications Safe in Pregnancy  Acne:      Constipation:  Benzoyl Peroxide     Colace  Clindamycin      Dulcolax Suppository  Topica  Erythromycin     Fibercon  Salicylic Acid      Metamucil         Miralax AVOID:        Senakot   Accutane    Cough:  Retin-A       Cough Drops  Tetracycline      Phenergan w/ Codeine if Rx  Minocycline      Robitussin (Plain & DM)  Antibiotics:     Crabs/Lice:  Ceclor       RID  Cephalosporins    AVOID:  E-Mycins      Kwell  Keflex  Macrobid/Macrodantin   Diarrhea:  Penicillin      Kao-Pectate  Zithromax      Imodium AD         PUSH FLUIDS AVOID:       Cipro     Fever:  Tetracycline      Tylenol (Regular or Extra  Minocycline       Strength)  Levaquin      Extra Strength-Do not          Exceed 8 tabs/24 hrs Caffeine:        '200mg'$ /day (equiv. To 1 cup of coffee or  approx. 3 12 oz sodas)         Gas: Cold/Hayfever:       Gas-X  Benadryl      Mylicon  Claritin       Phazyme  **Claritin-D        Chlor-Trimeton    Headaches:  Dimetapp      ASA-Free Excedrin  Drixoral-Non-Drowsy     Cold Compress  Mucinex (Guaifenasin)     Tylenol (Regular or Extra  Sudafed/Sudafed-12 Hour     Strength)  **Sudafed PE Pseudoephedrine   Tylenol Cold & Sinus     Vicks Vapor Rub  Zyrtec  **AVOID if Problems With Blood Pressure         Heartburn: Avoid lying down for at least 1 hour after meals  Aciphex      Maalox     Rash:  Milk of Magnesia     Benadryl    Mylanta       1% Hydrocortisone Cream  Pepcid  Pepcid Complete   Sleep Aids:  Prevacid      Ambien   Prilosec       Benadryl  Rolaids       Chamomile Tea  Tums (Limit 4/day)     Unisom  Zantac       Tylenol PM  Warm milk-add vanilla or  Hemorrhoids:       Sugar for taste  Anusol/Anusol H.C.  (RX: Analapram 2.5%)  Sugar Substitutes:  Hydrocortisone OTC     Ok in moderation  Preparation H      Tucks        Vaseline lotion applied to tissue with wiping    Herpes:     Throat:  Acyclovir      Oragel  Famvir  Valtrex     Vaccines:         Flu Shot Leg Cramps:       *Gardasil  Benadryl      Hepatitis  A         Hepatitis B Nasal Spray:       Pneumovax  Saline Nasal Spray     Polio Booster         Tetanus Nausea:       Tuberculosis test or PPD  Vitamin B6 25 mg TID   AVOID:    Dramamine      *Gardasil  Emetrol       Live Poliovirus  Ginger Root 250 mg QID    MMR (measles, mumps &  High Complex Carbs @ Bedtime    rebella)  Sea Bands-Accupressure    Varicella (Chickenpox)  Unisom 1/2 tab TID     *No known complications           If received before Pain:         Known pregnancy;   Darvocet       Resume series after  Lortab        Delivery  Percocet    Yeast:   Tramadol      Femstat  Tylenol 3      Gyne-lotrimin  Ultram       Monistat  Vicodin           MISC:         All Sunscreens           Hair Coloring/highlights          Insect Repellant's          (Including DEET)         Mystic Tans Third Trimester of Pregnancy The third trimester is from week 29 through week 42, months 7 through 9. This trimester is when your unborn baby (fetus) is growing very fast. At the end of the ninth month, the unborn baby is about 20 inches in length. It weighs about 6-10 pounds. Follow these instructions at home:  Avoid all smoking, herbs, and alcohol. Avoid drugs not approved by your doctor.  Do not use any tobacco products, including cigarettes, chewing tobacco, and electronic cigarettes. If you need help quitting, ask your doctor. You may get counseling or other support to help you quit.  Only take medicine as told by your doctor. Some medicines are safe and some are not during pregnancy.  Exercise only as told by your doctor. Stop exercising if you start having cramps.  Eat regular, healthy meals.  Wear a good support bra if your breasts are tender.  Do not use hot tubs, steam rooms, or saunas.  Wear your seat belt when driving.  Avoid raw meat, uncooked cheese, and liter boxes and soil used by cats.  Take your prenatal vitamins.  Take 1500-2000 milligrams of calcium daily  starting at the 20th week of pregnancy until you deliver your baby.  Try taking medicine that helps you poop (stool softener) as  needed, and if your doctor approves. Eat more fiber by eating fresh fruit, vegetables, and whole grains. Drink enough fluids to keep your pee (urine) clear or pale yellow.  Take warm water baths (sitz baths) to soothe pain or discomfort caused by hemorrhoids. Use hemorrhoid cream if your doctor approves.  If you have puffy, bulging veins (varicose veins), wear support hose. Raise (elevate) your feet for 15 minutes, 3-4 times a day. Limit salt in your diet.  Avoid heavy lifting, wear low heels, and sit up straight.  Rest with your legs raised if you have leg cramps or low back pain.  Visit your dentist if you have not gone during your pregnancy. Use a soft toothbrush to brush your teeth. Be gentle when you floss.  You can have sex (intercourse) unless your doctor tells you not to.  Do not travel far distances unless you must. Only do so with your doctor's approval.  Take prenatal classes.  Practice driving to the hospital.  Pack your hospital bag.  Prepare the baby's room.  Go to your doctor visits. Get help if:  You are not sure if you are in labor or if your water has broken.  You are dizzy.  You have mild cramps or pressure in your lower belly (abdominal).  You have a nagging pain in your belly area.  You continue to feel sick to your stomach (nauseous), throw up (vomit), or have watery poop (diarrhea).  You have bad smelling fluid coming from your vagina.  You have pain with peeing (urination). Get help right away if:  You have a fever.  You are leaking fluid from your vagina.  You are spotting or bleeding from your vagina.  You have severe belly cramping or pain.  You lose or gain weight rapidly.  You have trouble catching your breath and have chest pain.  You notice sudden or extreme puffiness (swelling) of your face, hands,  ankles, feet, or legs.  You have not felt the baby move in over an hour.  You have severe headaches that do not go away with medicine.  You have vision changes. This information is not intended to replace advice given to you by your health care provider. Make sure you discuss any questions you have with your health care provider. Document Released: 11/11/2009 Document Revised: 01/23/2016 Document Reviewed: 10/18/2012 Elsevier Interactive Patient Education  2017 [employer].

## 2018-03-29 NOTE — Progress Notes (Signed)
Pt is here for an ROB visit. 

## 2018-03-30 LAB — CBC
Hematocrit: 34 % (ref 34.0–46.6)
Hemoglobin: 11 g/dL — ABNORMAL LOW (ref 11.1–15.9)
MCH: 29.8 pg (ref 26.6–33.0)
MCHC: 32.4 g/dL (ref 31.5–35.7)
MCV: 92 fL (ref 79–97)
Platelets: 298 10*3/uL (ref 150–450)
RBC: 3.69 x10E6/uL — ABNORMAL LOW (ref 3.77–5.28)
RDW: 12.1 % — ABNORMAL LOW (ref 12.3–15.4)
WBC: 10 10*3/uL (ref 3.4–10.8)

## 2018-03-30 LAB — GLUCOSE, 1 HOUR GESTATIONAL: GESTATIONAL DIABETES SCREEN: 102 mg/dL (ref 65–139)

## 2018-03-30 LAB — RPR: RPR: NONREACTIVE

## 2018-03-30 MED ORDER — DOCUSATE SODIUM 100 MG PO CAPS: 100.0000 mg | ORAL_CAPSULE | Freq: Two times a day (BID) | ORAL | 2 refills | Status: DC | PRN

## 2018-03-30 NOTE — Progress Notes (Signed)
ROB-Reports constipation. Discussed bowel management program including colace and Miralax. 28 week labs with TSH today. TDaP given. Blood transfusion consent reviewed and signed. Third trimester education, see AVS. [facility] [employer] doula program pamphlet given. Anticipatory guidance regarding course of prenatal care. Reviewed red flag symptoms and when to call. RTC x 2 weeks for ROB or sooner if needed.

## 2018-04-11 ENCOUNTER — Ambulatory Visit (INDEPENDENT_AMBULATORY_CARE_PROVIDER_SITE_OTHER): Payer: 59 | Admitting: Certified Nurse Midwife

## 2018-04-11 ENCOUNTER — Encounter: Payer: Self-pay | Admitting: Certified Nurse Midwife

## 2018-04-11 VITALS — BP 102/59 | HR 70 | Wt 202.2 lb

## 2018-04-11 DIAGNOSIS — O219 Vomiting of pregnancy, unspecified: Secondary | ICD-10-CM

## 2018-04-11 DIAGNOSIS — Z3A29 29 weeks gestation of pregnancy: Secondary | ICD-10-CM

## 2018-04-11 LAB — POCT URINALYSIS DIPSTICK
Bilirubin, UA: NEGATIVE
Blood, UA: NEGATIVE
Glucose, UA: NEGATIVE
KETONES UA: NEGATIVE
LEUKOCYTES UA: NEGATIVE
Nitrite, UA: NEGATIVE
PROTEIN UA: NEGATIVE
Spec Grav, UA: 1.01 (ref 1.010–1.025)
Urobilinogen, UA: 0.2 E.U./dL
pH, UA: 6 (ref 5.0–8.0)

## 2018-04-11 NOTE — Patient Instructions (Signed)

## 2018-04-11 NOTE — Progress Notes (Signed)
ROB, doing well. Feels good movement. Is feeling anxious and uncomfortable with increase in fetal size. Reviewed normal discomforts of pregnancy. Follow up 2 wks.   Doreene BurkeAnnie Ector Laurel, CNM

## 2018-04-11 NOTE — Progress Notes (Signed)
Pt is here for an ROB visit. Is still dealing with N/V.

## 2018-04-26 ENCOUNTER — Ambulatory Visit (INDEPENDENT_AMBULATORY_CARE_PROVIDER_SITE_OTHER): Payer: 59 | Admitting: Certified Nurse Midwife

## 2018-04-26 VITALS — BP 92/60 | HR 77 | Wt 199.2 lb

## 2018-04-26 DIAGNOSIS — Z3403 Encounter for supervision of normal first pregnancy, third trimester: Secondary | ICD-10-CM

## 2018-04-26 LAB — POCT URINALYSIS DIPSTICK OB
BILIRUBIN UA: NEGATIVE
Glucose, UA: NEGATIVE — AB
KETONES UA: NEGATIVE
Leukocytes, UA: NEGATIVE
Nitrite, UA: NEGATIVE
PH UA: 8.5 — AB (ref 5.0–8.0)
POC,PROTEIN,UA: NEGATIVE
RBC UA: NEGATIVE
Spec Grav, UA: 1.01 (ref 1.010–1.025)
UROBILINOGEN UA: 0.2 U/dL

## 2018-04-26 NOTE — Progress Notes (Signed)
Pt is here for an ROB visit. 

## 2018-04-26 NOTE — Progress Notes (Signed)
ROB- Doing well. Plans for natural childbirth. States medications for constipation and nausea are relieving symptoms. Anticipatory guidance  regarding 36 week cultures and course of prenatal care. Reviewed red flag symptoms and when to call. RTC x 2-3 weeks for ROB or sooner if needed.

## 2018-04-26 NOTE — Patient Instructions (Signed)
Common Medications Safe in Pregnancy  Acne:      Constipation:  Benzoyl Peroxide     Colace  Clindamycin      Dulcolax Suppository  Topica Erythromycin     Fibercon  Salicylic Acid      Metamucil         Miralax AVOID:        Senakot   Accutane    Cough:  Retin-A       Cough Drops  Tetracycline      Phenergan w/ Codeine if Rx  Minocycline      Robitussin (Plain & DM)  Antibiotics:     Crabs/Lice:  Ceclor       RID  Cephalosporins    AVOID:  E-Mycins      Kwell  Keflex  Macrobid/Macrodantin   Diarrhea:  Penicillin      Kao-Pectate  Zithromax      Imodium AD         PUSH FLUIDS AVOID:       Cipro     Fever:  Tetracycline      Tylenol (Regular or Extra  Minocycline       Strength)  Levaquin      Extra Strength-Do not          Exceed 8 tabs/24 hrs Caffeine:        <200mg/day (equiv. To 1 cup of coffee or  approx. 3 12 oz sodas)         Gas: Cold/Hayfever:       Gas-X  Benadryl      Mylicon  Claritin       Phazyme  **Claritin-D        Chlor-Trimeton    Headaches:  Dimetapp      ASA-Free Excedrin  Drixoral-Non-Drowsy     Cold Compress  Mucinex (Guaifenasin)     Tylenol (Regular or Extra  Sudafed/Sudafed-12 Hour     Strength)  **Sudafed PE Pseudoephedrine   Tylenol Cold & Sinus     Vicks Vapor Rub  Zyrtec  **AVOID if Problems With Blood Pressure         Heartburn: Avoid lying down for at least 1 hour after meals  Aciphex      Maalox     Rash:  Milk of Magnesia     Benadryl    Mylanta       1% Hydrocortisone Cream  Pepcid  Pepcid Complete   Sleep Aids:  Prevacid      Ambien   Prilosec       Benadryl  Rolaids       Chamomile Tea  Tums (Limit 4/day)     Unisom  Zantac       Tylenol PM         Warm milk-add vanilla or  Hemorrhoids:       Sugar for taste  Anusol/Anusol H.C.  (RX: Analapram 2.5%)  Sugar Substitutes:  Hydrocortisone OTC     Ok in moderation  Preparation H      Tucks        Vaseline lotion applied to tissue with  wiping    Herpes:     Throat:  Acyclovir      Oragel  Famvir  Valtrex     Vaccines:         Flu Shot Leg Cramps:       *Gardasil  Benadryl      Hepatitis A         Hepatitis B Nasal Spray:         Pneumovax  Saline Nasal Spray     Polio Booster         Tetanus Nausea:       Tuberculosis test or PPD  Vitamin B6 25 mg TID   AVOID:    Dramamine      *Gardasil  Emetrol       Live Poliovirus  Ginger Root 250 mg QID    MMR (measles, mumps &  High Complex Carbs @ Bedtime    rebella)  Sea Bands-Accupressure    Varicella (Chickenpox)  Unisom 1/2 tab TID     *No known complications           If received before Pain:         Known pregnancy;   Darvocet       Resume series after  Lortab        Delivery  Percocet    Yeast:   Tramadol      Femstat  Tylenol 3      Gyne-lotrimin  Ultram       Monistat  Vicodin           MISC:         All Sunscreens           Hair Coloring/highlights          Insect Repellant's          (Including DEET)         Mystic Tans Vaginal Delivery Vaginal delivery means that you will give birth by pushing your baby out of your birth canal (vagina). A team of health care providers will help you before, during, and after vaginal delivery. Birth experiences are unique for every woman and every pregnancy, and birth experiences vary depending on where you choose to give birth. What should I do to prepare for my baby's birth? Before your baby is born, it is important to talk with your health care provider about:  Your labor and delivery preferences. These may include: ? Medicines that you may be given. ? How you will manage your pain. This might include non-medical pain relief techniques or injectable pain relief such as epidural analgesia. ? How you and your baby will be monitored during labor and delivery. ? Who may be in the labor and delivery room with you. ? Your feelings about surgical delivery of your baby (cesarean delivery, or C-section) if this becomes  necessary. ? Your feelings about receiving donated blood through an IV tube (blood transfusion) if this becomes necessary.  Whether you are able: ? To take pictures or videos of the birth. ? To eat during labor and delivery. ? To move around, walk, or change positions during labor and delivery.  What to expect after your baby is born, such as: ? Whether delayed umbilical cord clamping and cutting is offered. ? Who will care for your baby right after birth. ? Medicines or tests that may be recommended for your baby. ? Whether breastfeeding is supported in your hospital or birth center. ? How long you will be in the hospital or birth center.  How any medical conditions you have may affect your baby or your labor and delivery experience.  To prepare for your baby's birth, you should also:  Attend all of your health care visits before delivery (prenatal visits) as recommended by your health care provider. This is important.  Prepare your home for your baby's arrival. Make sure that you have: ? Diapers. ? Baby clothing. ? Feeding equipment. ? Safe   sleeping arrangements for you and your baby.  Install a car seat in your vehicle. Have your car seat checked by a certified car seat installer to make sure that it is installed safely.  Think about who will help you with your new baby at home for at least the first several weeks after delivery.  What can I expect when I arrive at the birth center or hospital? Once you are in labor and have been admitted into the hospital or birth center, your health care provider may:  Review your pregnancy history and any concerns you have.  Insert an IV tube into one of your veins. This is used to give you fluids and medicines.  Check your blood pressure, pulse, temperature, and heart rate (vital signs).  Check whether your bag of water (amniotic sac) has broken (ruptured).  Talk with you about your birth plan and discuss pain control  options.  Monitoring Your health care provider may monitor your contractions (uterine monitoring) and your baby's heart rate (fetal monitoring). You may need to be monitored:  Often, but not continuously (intermittently).  All the time or for long periods at a time (continuously). Continuous monitoring may be needed if: ? You are taking certain medicines, such as medicine to relieve pain or make your contractions stronger. ? You have pregnancy or labor complications.  Monitoring may be done by:  Placing a special stethoscope or a handheld monitoring device on your abdomen to check your baby's heartbeat, and feeling your abdomen for contractions. This method of monitoring does not continuously record your baby's heartbeat or your contractions.  Placing monitors on your abdomen (external monitors) to record your baby's heartbeat and the frequency and length of contractions. You may not have to wear external monitors all the time.  Placing monitors inside of your uterus (internal monitors) to record your baby's heartbeat and the frequency, length, and strength of your contractions. ? Your health care provider may use internal monitors if he or she needs more information about the strength of your contractions or your baby's heart rate. ? Internal monitors are put in place by passing a thin, flexible wire through your vagina and into your uterus. Depending on the type of monitor, it may remain in your uterus or on your baby's head until birth. ? Your health care provider will discuss the benefits and risks of internal monitoring with you and will ask for your permission before inserting the monitors.  Telemetry. This is a type of continuous monitoring that can be done with external or internal monitors. Instead of having to stay in bed, you are able to move around during telemetry. Ask your health care provider if telemetry is an option for you.  Physical exam Your health care provider may  perform a physical exam. This may include:  Checking whether your baby is positioned: ? With the head toward your vagina (head-down). This is most common. ? With the head toward the top of your uterus (head-up or breech). If your baby is in a breech position, your health care provider may try to turn your baby to a head-down position so you can deliver vaginally. If it does not seem that your baby can be born vaginally, your provider may recommend surgery to deliver your baby. In rare cases, you may be able to deliver vaginally if your baby is head-up (breech delivery). ? Lying sideways (transverse). Babies that are lying sideways cannot be delivered vaginally.  Checking your cervix to determine: ? Whether it   is thinning out (effacing). ? Whether it is opening up (dilating). ? How low your baby has moved into your birth canal.  What are the three stages of labor and delivery?  Normal labor and delivery is divided into the following three stages: Stage 1  Stage 1 is the longest stage of labor, and it can last for hours or days. Stage 1 includes: ? Early labor. This is when contractions may be irregular, or regular and mild. Generally, early labor contractions are more than 10 minutes apart. ? Active labor. This is when contractions get longer, more regular, more frequent, and more intense. ? The transition phase. This is when contractions happen very close together, are very intense, and may last longer than during any other part of labor.  Contractions generally feel mild, infrequent, and irregular at first. They get stronger, more frequent (about every 2-3 minutes), and more regular as you progress from early labor through active labor and transition.  Many women progress through stage 1 naturally, but you may need help to continue making progress. If this happens, your health care provider may talk with you about: ? Rupturing your amniotic sac if it has not ruptured yet. ? Giving you  medicine to help make your contractions stronger and more frequent.  Stage 1 ends when your cervix is completely dilated to 4 inches (10 cm) and completely effaced. This happens at the end of the transition phase. Stage 2  Once your cervix is completely effaced and dilated to 4 inches (10 cm), you may start to feel an urge to push. It is common for the body to naturally take a rest before feeling the urge to push, especially if you received an epidural or certain other pain medicines. This rest period may last for up to 1-2 hours, depending on your unique labor experience.  During stage 2, contractions are generally less painful, because pushing helps relieve contraction pain. Instead of contraction pain, you may feel stretching and burning pain, especially when the widest part of your baby's head passes through the vaginal opening (crowning).  Your health care provider will closely monitor your pushing progress and your baby's progress through the vagina during stage 2.  Your health care provider may massage the area of skin between your vaginal opening and anus (perineum) or apply warm compresses to your perineum. This helps it stretch as the baby's head starts to crown, which can help prevent perineal tearing. ? In some cases, an incision may be made in your perineum (episiotomy) to allow the baby to pass through the vaginal opening. An episiotomy helps to make the opening of the vagina larger to allow more room for the baby to fit through.  It is very important to breathe and focus so your health care provider can control the delivery of your baby's head. Your health care provider may have you decrease the intensity of your pushing, to help prevent perineal tearing.  After delivery of your baby's head, the shoulders and the rest of the body generally deliver very quickly and without difficulty.  Once your baby is delivered, the umbilical cord may be cut right away, or this may be delayed for  1-2 minutes, depending on your baby's health. This may vary among health care providers, hospitals, and birth centers.  If you and your baby are healthy enough, your baby may be placed on your chest or abdomen to help maintain the baby's temperature and to help you bond with each other. Some mothers and   babies start breastfeeding at this time. Your health care team will dry your baby and help keep your baby warm during this time.  Your baby may need immediate care if he or she: ? Showed signs of distress during labor. ? Has a medical condition. ? Was born too early (prematurely). ? Had a bowel movement before birth (meconium). ? Shows signs of difficulty transitioning from being inside the uterus to being outside of the uterus. If you are planning to breastfeed, your health care team will help you begin a feeding. Stage 3  The third stage of labor starts immediately after the birth of your baby and ends after you deliver the placenta. The placenta is an organ that develops during pregnancy to provide oxygen and nutrients to your baby in the womb.  Delivering the placenta may require some pushing, and you may have mild contractions. Breastfeeding can stimulate contractions to help you deliver the placenta.  After the placenta is delivered, your uterus should tighten (contract) and become firm. This helps to stop bleeding in your uterus. To help your uterus contract and to control bleeding, your health care provider may: ? Give you medicine by injection, through an IV tube, by mouth, or through your rectum (rectally). ? Massage your abdomen or perform a vaginal exam to remove any blood clots that are left in your uterus. ? Empty your bladder by placing a thin, flexible tube (catheter) into your bladder. ? Encourage you to breastfeed your baby. After labor is over, you and your baby will be monitored closely to ensure that you are both healthy until you are ready to go home. Your health care team  will teach you how to care for yourself and your baby. This information is not intended to replace advice given to you by your health care provider. Make sure you discuss any questions you have with your health care provider. Document Released: 05/26/2008 Document Revised: 03/06/2016 Document Reviewed: 09/01/2015 Elsevier Interactive Patient Education  2018 [employer].

## 2018-04-29 DIAGNOSIS — Z3482 Encounter for supervision of other normal pregnancy, second trimester: Secondary | ICD-10-CM | POA: Diagnosis not present

## 2018-04-29 DIAGNOSIS — Z3483 Encounter for supervision of other normal pregnancy, third trimester: Secondary | ICD-10-CM | POA: Diagnosis not present

## 2018-05-17 ENCOUNTER — Ambulatory Visit (INDEPENDENT_AMBULATORY_CARE_PROVIDER_SITE_OTHER): Payer: 59 | Admitting: Certified Nurse Midwife

## 2018-05-17 ENCOUNTER — Encounter: Payer: Self-pay | Admitting: Certified Nurse Midwife

## 2018-05-17 VITALS — BP 103/71 | HR 74 | Wt 200.1 lb

## 2018-05-17 DIAGNOSIS — Z3403 Encounter for supervision of normal first pregnancy, third trimester: Secondary | ICD-10-CM

## 2018-05-17 LAB — POCT URINALYSIS DIPSTICK
Bilirubin, UA: NEGATIVE
GLUCOSE UA: NEGATIVE
Ketones, UA: NEGATIVE
LEUKOCYTES UA: NEGATIVE
Nitrite, UA: NEGATIVE
Protein, UA: NEGATIVE
RBC UA: NEGATIVE
Spec Grav, UA: 1.015 (ref 1.010–1.025)
Urobilinogen, UA: 0.2 E.U./dL
pH, UA: 6 (ref 5.0–8.0)

## 2018-05-17 NOTE — Progress Notes (Signed)
ROB,  Doing well. Feels good fetal movement.  Asks about having cervix checked because her husband is going out of town. She denies increased pressure or braxton hick. Discussed release of prostaglandin with cervical exam and encouraged againts it. Pt verbalizes and agrees to plan . Discussed GBS testing at next visit. Follow up 1 wk.   Doreene BurkeAnnie Calloway Andrus, CNM

## 2018-05-17 NOTE — Patient Instructions (Signed)
Group B Streptococcus Infection During Pregnancy Group B Streptococcus (GBS) is a type of bacteria (Streptococcus agalactiae) that is often found in healthy people, commonly in the rectum, vagina, and intestines. In people who are healthy and not pregnant, the bacteria rarely cause serious illness or complications. However, women who test positive for GBS during pregnancy can pass the bacteria to their baby during childbirth, which can cause serious infection in the baby after birth. Women with GBS may also have infections during their pregnancy or immediately after childbirth, such as such as urinary tract infections (UTIs) or infections of the uterus (uterine infections). Having GBS also increases a woman's risk of complications during pregnancy, such as early (preterm) labor or delivery, miscarriage, or stillbirth. Routine testing (screening) for GBS is recommended for all pregnant women. What increases the risk? You may have a higher risk for GBS infection during pregnancy if you had one during a past pregnancy. What are the signs or symptoms? In most cases, GBS infection does not cause symptoms in pregnant women. Signs and symptoms of a possible GBS-related infection may include:  Labor starting before the 37th week of pregnancy.  A UTI or bladder infection, which may cause: ? Fever. ? Pain or burning during urination. ? Frequent urination.  Fever during labor, along with: ? Bad-smelling discharge. ? Uterine tenderness. ? Rapid heartbeat in the mother, baby, or both.  Rare but serious symptoms of a possible GBS-related infection in women include:  Blood infection (septicemia). This may cause fever, chills, or confusion.  Lung infection (pneumonia). This may cause fever, chills, cough, rapid breathing, difficulty breathing, or chest pain.  Bone, joint, skin, or soft tissue infection.  How is this diagnosed? You may be screened for GBS between week 35 and week 37 of your pregnancy. If  you have symptoms of preterm labor, you may be screened earlier. This condition is diagnosed based on lab test results from:  A swab of fluid from the vagina and rectum.  A urine sample.  How is this treated? This condition is treated with antibiotic medicine. When you go into labor, or as soon as your water breaks (your membranes rupture), you will be given antibiotics through an IV tube. Antibiotics will continue until after you give birth. If you are having a cesarean delivery, you do not need antibiotics unless your membranes have already ruptured. Follow these instructions at home:  Take over-the-counter and prescription medicines only as told by your health care provider.  Take your antibiotic medicine as told by your health care provider. Do not stop taking the antibiotic even if you start to feel better.  Keep all pre-birth (prenatal) visits and follow-up visits as told by your health care provider. This is important. Contact a health care provider if:  You have pain or burning when you urinate.  You have to urinate frequently.  You have a fever or chills.  You develop a bad-smelling vaginal discharge. Get help right away if:  Your membranes rupture.  You go into labor.  You have severe pain in your abdomen.  You have difficulty breathing.  You have chest pain. This information is not intended to replace advice given to you by your health care provider. Make sure you discuss any questions you have with your health care provider. Document Released: 11/24/2007 Document Revised: 03/13/2016 Document Reviewed: 03/12/2016 Elsevier Interactive Patient Education  2018 [employer]  

## 2018-05-22 ENCOUNTER — Other Ambulatory Visit: Payer: Self-pay | Admitting: Certified Nurse Midwife

## 2018-05-23 ENCOUNTER — Other Ambulatory Visit: Payer: Self-pay | Admitting: *Deleted

## 2018-05-23 ENCOUNTER — Ambulatory Visit (INDEPENDENT_AMBULATORY_CARE_PROVIDER_SITE_OTHER): Payer: 59 | Admitting: Certified Nurse Midwife

## 2018-05-23 VITALS — BP 109/65 | HR 80 | Wt 203.3 lb

## 2018-05-23 DIAGNOSIS — Z3685 Encounter for antenatal screening for Streptococcus B: Secondary | ICD-10-CM

## 2018-05-23 DIAGNOSIS — Z3493 Encounter for supervision of normal pregnancy, unspecified, third trimester: Secondary | ICD-10-CM

## 2018-05-23 DIAGNOSIS — Z113 Encounter for screening for infections with a predominantly sexual mode of transmission: Secondary | ICD-10-CM

## 2018-05-23 LAB — POCT URINALYSIS DIPSTICK OB
Bilirubin, UA: NEGATIVE
Blood, UA: NEGATIVE
KETONES UA: NEGATIVE
Leukocytes, UA: NEGATIVE
Nitrite, UA: NEGATIVE
SPEC GRAV UA: 1.01 (ref 1.010–1.025)
Urobilinogen, UA: 0.2 E.U./dL
pH, UA: 6.5 (ref 5.0–8.0)

## 2018-05-23 LAB — OB RESULTS CONSOLE GBS: GBS: NEGATIVE

## 2018-05-23 MED ORDER — ONDANSETRON HCL 4 MG PO TABS: 4.0000 mg | ORAL_TABLET | Freq: Three times a day (TID) | ORAL | 2 refills | Status: DC | PRN

## 2018-05-23 NOTE — Patient Instructions (Signed)
Vaginal Delivery Vaginal delivery means that you will give birth by pushing your baby out of your birth canal (vagina). A team of health care providers will help you before, during, and after vaginal delivery. Birth experiences are unique for every woman and every pregnancy, and birth experiences vary depending on where you choose to give birth. What should I do to prepare for my baby's birth? Before your baby is born, it is important to talk with your health care provider about:  Your labor and delivery preferences. These may include: ? Medicines that you may be given. ? How you will manage your pain. This might include non-medical pain relief techniques or injectable pain relief such as epidural analgesia. ? How you and your baby will be monitored during labor and delivery. ? Who may be in the labor and delivery room with you. ? Your feelings about surgical delivery of your baby (cesarean delivery, or C-section) if this becomes necessary. ? Your feelings about receiving donated blood through an IV tube (blood transfusion) if this becomes necessary.  Whether you are able: ? To take pictures or videos of the birth. ? To eat during labor and delivery. ? To move around, walk, or change positions during labor and delivery.  What to expect after your baby is born, such as: ? Whether delayed umbilical cord clamping and cutting is offered. ? Who will care for your baby right after birth. ? Medicines or tests that may be recommended for your baby. ? Whether breastfeeding is supported in your hospital or birth center. ? How long you will be in the hospital or birth center.  How any medical conditions you have may affect your baby or your labor and delivery experience.  To prepare for your baby's birth, you should also:  Attend all of your health care visits before delivery (prenatal visits) as recommended by your health care provider. This is important.  Prepare your home for your baby's  arrival. Make sure that you have: ? Diapers. ? Baby clothing. ? Feeding equipment. ? Safe sleeping arrangements for you and your baby.  Install a car seat in your vehicle. Have your car seat checked by a certified car seat installer to make sure that it is installed safely.  Think about who will help you with your new baby at home for at least the first several weeks after delivery.  What can I expect when I arrive at the birth center or hospital? Once you are in labor and have been admitted into the hospital or birth center, your health care provider may:  Review your pregnancy history and any concerns you have.  Insert an IV tube into one of your veins. This is used to give you fluids and medicines.  Check your blood pressure, pulse, temperature, and heart rate (vital signs).  Check whether your bag of water (amniotic sac) has broken (ruptured).  Talk with you about your birth plan and discuss pain control options.  Monitoring Your health care provider may monitor your contractions (uterine monitoring) and your baby's heart rate (fetal monitoring). You may need to be monitored:  Often, but not continuously (intermittently).  All the time or for long periods at a time (continuously). Continuous monitoring may be needed if: ? You are taking certain medicines, such as medicine to relieve pain or make your contractions stronger. ? You have pregnancy or labor complications.  Monitoring may be done by:  Placing a special stethoscope or a handheld monitoring device on your abdomen to   check your baby's heartbeat, and feeling your abdomen for contractions. This method of monitoring does not continuously record your baby's heartbeat or your contractions.  Placing monitors on your abdomen (external monitors) to record your baby's heartbeat and the frequency and length of contractions. You may not have to wear external monitors all the time.  Placing monitors inside of your uterus  (internal monitors) to record your baby's heartbeat and the frequency, length, and strength of your contractions. ? Your health care provider may use internal monitors if he or she needs more information about the strength of your contractions or your baby's heart rate. ? Internal monitors are put in place by passing a thin, flexible wire through your vagina and into your uterus. Depending on the type of monitor, it may remain in your uterus or on your baby's head until birth. ? Your health care provider will discuss the benefits and risks of internal monitoring with you and will ask for your permission before inserting the monitors.  Telemetry. This is a type of continuous monitoring that can be done with external or internal monitors. Instead of having to stay in bed, you are able to move around during telemetry. Ask your health care provider if telemetry is an option for you.  Physical exam Your health care provider may perform a physical exam. This may include:  Checking whether your baby is positioned: ? With the head toward your vagina (head-down). This is most common. ? With the head toward the top of your uterus (head-up or breech). If your baby is in a breech position, your health care provider may try to turn your baby to a head-down position so you can deliver vaginally. If it does not seem that your baby can be born vaginally, your provider may recommend surgery to deliver your baby. In rare cases, you may be able to deliver vaginally if your baby is head-up (breech delivery). ? Lying sideways (transverse). Babies that are lying sideways cannot be delivered vaginally.  Checking your cervix to determine: ? Whether it is thinning out (effacing). ? Whether it is opening up (dilating). ? How low your baby has moved into your birth canal.  What are the three stages of labor and delivery?  Normal labor and delivery is divided into the following three stages: Stage 1  Stage 1 is the  longest stage of labor, and it can last for hours or days. Stage 1 includes: ? Early labor. This is when contractions may be irregular, or regular and mild. Generally, early labor contractions are more than 10 minutes apart. ? Active labor. This is when contractions get longer, more regular, more frequent, and more intense. ? The transition phase. This is when contractions happen very close together, are very intense, and may last longer than during any other part of labor.  Contractions generally feel mild, infrequent, and irregular at first. They get stronger, more frequent (about every 2-3 minutes), and more regular as you progress from early labor through active labor and transition.  Many women progress through stage 1 naturally, but you may need help to continue making progress. If this happens, your health care provider may talk with you about: ? Rupturing your amniotic sac if it has not ruptured yet. ? Giving you medicine to help make your contractions stronger and more frequent.  Stage 1 ends when your cervix is completely dilated to 4 inches (10 cm) and completely effaced. This happens at the end of the transition phase. Stage 2  Once   your cervix is completely effaced and dilated to 4 inches (10 cm), you may start to feel an urge to push. It is common for the body to naturally take a rest before feeling the urge to push, especially if you received an epidural or certain other pain medicines. This rest period may last for up to 1-2 hours, depending on your unique labor experience.  During stage 2, contractions are generally less painful, because pushing helps relieve contraction pain. Instead of contraction pain, you may feel stretching and burning pain, especially when the widest part of your baby's head passes through the vaginal opening (crowning).  Your health care provider will closely monitor your pushing progress and your baby's progress through the vagina during stage 2.  Your  health care provider may massage the area of skin between your vaginal opening and anus (perineum) or apply warm compresses to your perineum. This helps it stretch as the baby's head starts to crown, which can help prevent perineal tearing. ? In some cases, an incision may be made in your perineum (episiotomy) to allow the baby to pass through the vaginal opening. An episiotomy helps to make the opening of the vagina larger to allow more room for the baby to fit through.  It is very important to breathe and focus so your health care provider can control the delivery of your baby's head. Your health care provider may have you decrease the intensity of your pushing, to help prevent perineal tearing.  After delivery of your baby's head, the shoulders and the rest of the body generally deliver very quickly and without difficulty.  Once your baby is delivered, the umbilical cord may be cut right away, or this may be delayed for 1-2 minutes, depending on your baby's health. This may vary among health care providers, hospitals, and birth centers.  If you and your baby are healthy enough, your baby may be placed on your chest or abdomen to help maintain the baby's temperature and to help you bond with each other. Some mothers and babies start breastfeeding at this time. Your health care team will dry your baby and help keep your baby warm during this time.  Your baby may need immediate care if he or she: ? Showed signs of distress during labor. ? Has a medical condition. ? Was born too early (prematurely). ? Had a bowel movement before birth (meconium). ? Shows signs of difficulty transitioning from being inside the uterus to being outside of the uterus. If you are planning to breastfeed, your health care team will help you begin a feeding. Stage 3  The third stage of labor starts immediately after the birth of your baby and ends after you deliver the placenta. The placenta is an organ that develops  during pregnancy to provide oxygen and nutrients to your baby in the womb.  Delivering the placenta may require some pushing, and you may have mild contractions. Breastfeeding can stimulate contractions to help you deliver the placenta.  After the placenta is delivered, your uterus should tighten (contract) and become firm. This helps to stop bleeding in your uterus. To help your uterus contract and to control bleeding, your health care provider may: ? Give you medicine by injection, through an IV tube, by mouth, or through your rectum (rectally). ? Massage your abdomen or perform a vaginal exam to remove any blood clots that are left in your uterus. ? Empty your bladder by placing a thin, flexible tube (catheter) into your bladder. ? Encourage   you to breastfeed your baby. After labor is over, you and your baby will be monitored closely to ensure that you are both healthy until you are ready to go home. Your health care team will teach you how to care for yourself and your baby. This information is not intended to replace advice given to you by your health care provider. Make sure you discuss any questions you have with your health care provider. Document Released: 05/26/2008 Document Revised: 03/06/2016 Document Reviewed: 09/01/2015 Elsevier Interactive Patient Education  2018 [employer]  

## 2018-05-23 NOTE — Progress Notes (Signed)
ROB- cultures obtained, she is having some pelvic pressure 

## 2018-05-25 LAB — GC/CHLAMYDIA PROBE AMP
Chlamydia trachomatis, NAA: NEGATIVE
NEISSERIA GONORRHOEAE BY PCR: NEGATIVE

## 2018-05-25 LAB — STREP GP B NAA+RFLX: Strep Gp B NAA+Rflx: NEGATIVE

## 2018-05-25 NOTE — Progress Notes (Signed)
ROB-36 week cultures collected. Herbal prep handout given. Reviewed red flag symptoms and when to call. RTC x 1 week for ROB or sooner if needed.

## 2018-05-31 ENCOUNTER — Ambulatory Visit (INDEPENDENT_AMBULATORY_CARE_PROVIDER_SITE_OTHER): Payer: 59 | Admitting: Certified Nurse Midwife

## 2018-05-31 ENCOUNTER — Encounter: Payer: Self-pay | Admitting: Certified Nurse Midwife

## 2018-05-31 VITALS — BP 95/63 | HR 68 | Wt 202.5 lb

## 2018-05-31 DIAGNOSIS — Z3493 Encounter for supervision of normal pregnancy, unspecified, third trimester: Secondary | ICD-10-CM

## 2018-05-31 LAB — POCT URINALYSIS DIPSTICK OB
BILIRUBIN UA: NEGATIVE
Glucose, UA: NEGATIVE
KETONES UA: NEGATIVE
Leukocytes, UA: NEGATIVE
NITRITE UA: NEGATIVE
PH UA: 6 (ref 5.0–8.0)
RBC UA: NEGATIVE
Spec Grav, UA: 1.025 (ref 1.010–1.025)
UROBILINOGEN UA: 0.2 U/dL

## 2018-05-31 NOTE — Progress Notes (Signed)
ROB doing well. No complaints. Feels good movement. Discussed labor precautions. Follow up 1 wk.   Doreene Burke, CNM

## 2018-05-31 NOTE — Patient Instructions (Signed)
Braxton Hicks Contractions °Contractions of the uterus can occur throughout pregnancy, but they are not always a sign that you are in labor. You may have practice contractions called Braxton Hicks contractions. These false labor contractions are sometimes confused with true labor. °What are Braxton Hicks contractions? °Braxton Hicks contractions are tightening movements that occur in the muscles of the uterus before labor. Unlike true labor contractions, these contractions do not result in opening (dilation) and thinning of the cervix. Toward the end of pregnancy (32-34 weeks), Braxton Hicks contractions can happen more often and may become stronger. These contractions are sometimes difficult to tell apart from true labor because they can be very uncomfortable. You should not feel embarrassed if you go to the hospital with false labor. °Sometimes, the only way to tell if you are in true labor is for your health care provider to look for changes in the cervix. The health care provider will do a physical exam and may monitor your contractions. If you are not in true labor, the exam should show that your cervix is not dilating and your water has not broken. °If there are other health problems associated with your pregnancy, it is completely safe for you to be sent home with false labor. You may continue to have Braxton Hicks contractions until you go into true labor. °How to tell the difference between true labor and false labor °True labor °· Contractions last 30-70 seconds. °· Contractions become very regular. °· Discomfort is usually felt in the top of the uterus, and it spreads to the lower abdomen and low back. °· Contractions do not go away with walking. °· Contractions usually become more intense and increase in frequency. °· The cervix dilates and gets thinner. °False labor °· Contractions are usually shorter and not as strong as true labor contractions. °· Contractions are usually irregular. °· Contractions  are often felt in the front of the lower abdomen and in the groin. °· Contractions may go away when you walk around or change positions while lying down. °· Contractions get weaker and are shorter-lasting as time goes on. °· The cervix usually does not dilate or become thin. °Follow these instructions at home: °· Take over-the-counter and prescription medicines only as told by your health care provider. °· Keep up with your usual exercises and follow other instructions from your health care provider. °· Eat and drink lightly if you think you are going into labor. °· If Braxton Hicks contractions are making you uncomfortable: °? Change your position from lying down or resting to walking, or change from walking to resting. °? Sit and rest in a tub of warm water. °? Drink enough fluid to keep your urine pale yellow. Dehydration may cause these contractions. °? Do slow and deep breathing several times an hour. °· Keep all follow-up prenatal visits as told by your health care provider. This is important. °Contact a health care provider if: °· You have a fever. °· You have continuous pain in your abdomen. °Get help right away if: °· Your contractions become stronger, more regular, and closer together. °· You have fluid leaking or gushing from your vagina. °· You pass blood-tinged mucus (bloody show). °· You have bleeding from your vagina. °· You have low back pain that you never had before. °· You feel your baby’s head pushing down and causing pelvic pressure. °· Your baby is not moving inside you as much as it used to. °Summary °· Contractions that occur before labor are called Braxton   Hicks contractions, false labor, or practice contractions. °· Braxton Hicks contractions are usually shorter, weaker, farther apart, and less regular than true labor contractions. True labor contractions usually become progressively stronger and regular and they become more frequent. °· Manage discomfort from Braxton Hicks contractions by  changing position, resting in a warm bath, drinking plenty of water, or practicing deep breathing. °This information is not intended to replace advice given to you by your health care provider. Make sure you discuss any questions you have with your health care provider. °Document Released: 12/31/2016 Document Revised: 12/31/2016 Document Reviewed: 12/31/2016 °Elsevier Interactive Patient Education © 2018 [employer] ° °

## 2018-06-07 ENCOUNTER — Ambulatory Visit (INDEPENDENT_AMBULATORY_CARE_PROVIDER_SITE_OTHER): Payer: 59 | Admitting: Certified Nurse Midwife

## 2018-06-07 VITALS — BP 95/71 | HR 70 | Wt 205.3 lb

## 2018-06-07 DIAGNOSIS — Z3493 Encounter for supervision of normal pregnancy, unspecified, third trimester: Secondary | ICD-10-CM | POA: Diagnosis not present

## 2018-06-07 LAB — POCT URINALYSIS DIPSTICK OB
BILIRUBIN UA: NEGATIVE
GLUCOSE, UA: NEGATIVE
Ketones, UA: NEGATIVE
Leukocytes, UA: NEGATIVE
Nitrite, UA: NEGATIVE
POC,PROTEIN,UA: NEGATIVE
RBC UA: NEGATIVE
Spec Grav, UA: 1.01 (ref 1.010–1.025)
Urobilinogen, UA: 0.2 E.U./dL
pH, UA: 6.5 (ref 5.0–8.0)

## 2018-06-07 NOTE — Progress Notes (Signed)
ROB, c/o increased pelvic pressure x1 week.

## 2018-06-07 NOTE — Patient Instructions (Signed)
Vaginal Delivery Vaginal delivery means that you will give birth by pushing your baby out of your birth canal (vagina). A team of health care providers will help you before, during, and after vaginal delivery. Birth experiences are unique for every woman and every pregnancy, and birth experiences vary depending on where you choose to give birth. What should I do to prepare for my baby's birth? Before your baby is born, it is important to talk with your health care provider about:  Your labor and delivery preferences. These may include: ? Medicines that you may be given. ? How you will manage your pain. This might include non-medical pain relief techniques or injectable pain relief such as epidural analgesia. ? How you and your baby will be monitored during labor and delivery. ? Who may be in the labor and delivery room with you. ? Your feelings about surgical delivery of your baby (cesarean delivery, or C-section) if this becomes necessary. ? Your feelings about receiving donated blood through an IV tube (blood transfusion) if this becomes necessary.  Whether you are able: ? To take pictures or videos of the birth. ? To eat during labor and delivery. ? To move around, walk, or change positions during labor and delivery.  What to expect after your baby is born, such as: ? Whether delayed umbilical cord clamping and cutting is offered. ? Who will care for your baby right after birth. ? Medicines or tests that may be recommended for your baby. ? Whether breastfeeding is supported in your hospital or birth center. ? How long you will be in the hospital or birth center.  How any medical conditions you have may affect your baby or your labor and delivery experience.  To prepare for your baby's birth, you should also:  Attend all of your health care visits before delivery (prenatal visits) as recommended by your health care provider. This is important.  Prepare your home for your baby's  arrival. Make sure that you have: ? Diapers. ? Baby clothing. ? Feeding equipment. ? Safe sleeping arrangements for you and your baby.  Install a car seat in your vehicle. Have your car seat checked by a certified car seat installer to make sure that it is installed safely.  Think about who will help you with your new baby at home for at least the first several weeks after delivery.  What can I expect when I arrive at the birth center or hospital? Once you are in labor and have been admitted into the hospital or birth center, your health care provider may:  Review your pregnancy history and any concerns you have.  Insert an IV tube into one of your veins. This is used to give you fluids and medicines.  Check your blood pressure, pulse, temperature, and heart rate (vital signs).  Check whether your bag of water (amniotic sac) has broken (ruptured).  Talk with you about your birth plan and discuss pain control options.  Monitoring Your health care provider may monitor your contractions (uterine monitoring) and your baby's heart rate (fetal monitoring). You may need to be monitored:  Often, but not continuously (intermittently).  All the time or for long periods at a time (continuously). Continuous monitoring may be needed if: ? You are taking certain medicines, such as medicine to relieve pain or make your contractions stronger. ? You have pregnancy or labor complications.  Monitoring may be done by:  Placing a special stethoscope or a handheld monitoring device on your abdomen to   check your baby's heartbeat, and feeling your abdomen for contractions. This method of monitoring does not continuously record your baby's heartbeat or your contractions.  Placing monitors on your abdomen (external monitors) to record your baby's heartbeat and the frequency and length of contractions. You may not have to wear external monitors all the time.  Placing monitors inside of your uterus  (internal monitors) to record your baby's heartbeat and the frequency, length, and strength of your contractions. ? Your health care provider may use internal monitors if he or she needs more information about the strength of your contractions or your baby's heart rate. ? Internal monitors are put in place by passing a thin, flexible wire through your vagina and into your uterus. Depending on the type of monitor, it may remain in your uterus or on your baby's head until birth. ? Your health care provider will discuss the benefits and risks of internal monitoring with you and will ask for your permission before inserting the monitors.  Telemetry. This is a type of continuous monitoring that can be done with external or internal monitors. Instead of having to stay in bed, you are able to move around during telemetry. Ask your health care provider if telemetry is an option for you.  Physical exam Your health care provider may perform a physical exam. This may include:  Checking whether your baby is positioned: ? With the head toward your vagina (head-down). This is most common. ? With the head toward the top of your uterus (head-up or breech). If your baby is in a breech position, your health care provider may try to turn your baby to a head-down position so you can deliver vaginally. If it does not seem that your baby can be born vaginally, your provider may recommend surgery to deliver your baby. In rare cases, you may be able to deliver vaginally if your baby is head-up (breech delivery). ? Lying sideways (transverse). Babies that are lying sideways cannot be delivered vaginally.  Checking your cervix to determine: ? Whether it is thinning out (effacing). ? Whether it is opening up (dilating). ? How low your baby has moved into your birth canal.  What are the three stages of labor and delivery?  Normal labor and delivery is divided into the following three stages: Stage 1  Stage 1 is the  longest stage of labor, and it can last for hours or days. Stage 1 includes: ? Early labor. This is when contractions may be irregular, or regular and mild. Generally, early labor contractions are more than 10 minutes apart. ? Active labor. This is when contractions get longer, more regular, more frequent, and more intense. ? The transition phase. This is when contractions happen very close together, are very intense, and may last longer than during any other part of labor.  Contractions generally feel mild, infrequent, and irregular at first. They get stronger, more frequent (about every 2-3 minutes), and more regular as you progress from early labor through active labor and transition.  Many women progress through stage 1 naturally, but you may need help to continue making progress. If this happens, your health care provider may talk with you about: ? Rupturing your amniotic sac if it has not ruptured yet. ? Giving you medicine to help make your contractions stronger and more frequent.  Stage 1 ends when your cervix is completely dilated to 4 inches (10 cm) and completely effaced. This happens at the end of the transition phase. Stage 2  Once   your cervix is completely effaced and dilated to 4 inches (10 cm), you may start to feel an urge to push. It is common for the body to naturally take a rest before feeling the urge to push, especially if you received an epidural or certain other pain medicines. This rest period may last for up to 1-2 hours, depending on your unique labor experience.  During stage 2, contractions are generally less painful, because pushing helps relieve contraction pain. Instead of contraction pain, you may feel stretching and burning pain, especially when the widest part of your baby's head passes through the vaginal opening (crowning).  Your health care provider will closely monitor your pushing progress and your baby's progress through the vagina during stage 2.  Your  health care provider may massage the area of skin between your vaginal opening and anus (perineum) or apply warm compresses to your perineum. This helps it stretch as the baby's head starts to crown, which can help prevent perineal tearing. ? In some cases, an incision may be made in your perineum (episiotomy) to allow the baby to pass through the vaginal opening. An episiotomy helps to make the opening of the vagina larger to allow more room for the baby to fit through.  It is very important to breathe and focus so your health care provider can control the delivery of your baby's head. Your health care provider may have you decrease the intensity of your pushing, to help prevent perineal tearing.  After delivery of your baby's head, the shoulders and the rest of the body generally deliver very quickly and without difficulty.  Once your baby is delivered, the umbilical cord may be cut right away, or this may be delayed for 1-2 minutes, depending on your baby's health. This may vary among health care providers, hospitals, and birth centers.  If you and your baby are healthy enough, your baby may be placed on your chest or abdomen to help maintain the baby's temperature and to help you bond with each other. Some mothers and babies start breastfeeding at this time. Your health care team will dry your baby and help keep your baby warm during this time.  Your baby may need immediate care if he or she: ? Showed signs of distress during labor. ? Has a medical condition. ? Was born too early (prematurely). ? Had a bowel movement before birth (meconium). ? Shows signs of difficulty transitioning from being inside the uterus to being outside of the uterus. If you are planning to breastfeed, your health care team will help you begin a feeding. Stage 3  The third stage of labor starts immediately after the birth of your baby and ends after you deliver the placenta. The placenta is an organ that develops  during pregnancy to provide oxygen and nutrients to your baby in the womb.  Delivering the placenta may require some pushing, and you may have mild contractions. Breastfeeding can stimulate contractions to help you deliver the placenta.  After the placenta is delivered, your uterus should tighten (contract) and become firm. This helps to stop bleeding in your uterus. To help your uterus contract and to control bleeding, your health care provider may: ? Give you medicine by injection, through an IV tube, by mouth, or through your rectum (rectally). ? Massage your abdomen or perform a vaginal exam to remove any blood clots that are left in your uterus. ? Empty your bladder by placing a thin, flexible tube (catheter) into your bladder. ? Encourage   you to breastfeed your baby. After labor is over, you and your baby will be monitored closely to ensure that you are both healthy until you are ready to go home. Your health care team will teach you how to care for yourself and your baby. This information is not intended to replace advice given to you by your health care provider. Make sure you discuss any questions you have with your health care provider. Document Released: 05/26/2008 Document Revised: 03/06/2016 Document Reviewed: 09/01/2015 Elsevier Interactive Patient Education  2018 [employer]  

## 2018-06-07 NOTE — Progress Notes (Signed)
ROB-Reports irregular contractions and pelvic pressure. Requests SVE. Encourage home labor preparation techniques. Reviewed red flag symptoms and when to call. RTC x 1 week for ROB or sooner if needed.

## 2018-06-08 DIAGNOSIS — M5489 Other dorsalgia: Secondary | ICD-10-CM | POA: Diagnosis not present

## 2018-06-09 DIAGNOSIS — H5213 Myopia, bilateral: Secondary | ICD-10-CM | POA: Diagnosis not present

## 2018-06-09 DIAGNOSIS — H52221 Regular astigmatism, right eye: Secondary | ICD-10-CM | POA: Diagnosis not present

## 2018-06-14 ENCOUNTER — Encounter: Payer: Self-pay | Admitting: Certified Nurse Midwife

## 2018-06-14 ENCOUNTER — Ambulatory Visit (INDEPENDENT_AMBULATORY_CARE_PROVIDER_SITE_OTHER): Payer: 59 | Admitting: Certified Nurse Midwife

## 2018-06-14 ENCOUNTER — Other Ambulatory Visit: Payer: Self-pay | Admitting: Certified Nurse Midwife

## 2018-06-14 ENCOUNTER — Encounter: Payer: 59 | Admitting: Certified Nurse Midwife

## 2018-06-14 VITALS — BP 101/66 | HR 71 | Wt 208.2 lb

## 2018-06-14 DIAGNOSIS — O480 Post-term pregnancy: Secondary | ICD-10-CM

## 2018-06-14 DIAGNOSIS — Z3493 Encounter for supervision of normal pregnancy, unspecified, third trimester: Secondary | ICD-10-CM

## 2018-06-14 NOTE — Progress Notes (Signed)
ROB doing well. Discussed BPP/growth u/s at next visit and scheduling of induction at 41 wks. Reviewed labor precautions. SVE per pt request 2/50/-2. Follow up 1 wk with Marcelino Duster.   Doreene Burke, CNM

## 2018-06-14 NOTE — Patient Instructions (Signed)
Braxton Hicks Contractions °Contractions of the uterus can occur throughout pregnancy, but they are not always a sign that you are in labor. You may have practice contractions called Braxton Hicks contractions. These false labor contractions are sometimes confused with true labor. °What are Braxton Hicks contractions? °Braxton Hicks contractions are tightening movements that occur in the muscles of the uterus before labor. Unlike true labor contractions, these contractions do not result in opening (dilation) and thinning of the cervix. Toward the end of pregnancy (32-34 weeks), Braxton Hicks contractions can happen more often and may become stronger. These contractions are sometimes difficult to tell apart from true labor because they can be very uncomfortable. You should not feel embarrassed if you go to the hospital with false labor. °Sometimes, the only way to tell if you are in true labor is for your health care provider to look for changes in the cervix. The health care provider will do a physical exam and may monitor your contractions. If you are not in true labor, the exam should show that your cervix is not dilating and your water has not broken. °If there are other health problems associated with your pregnancy, it is completely safe for you to be sent home with false labor. You may continue to have Braxton Hicks contractions until you go into true labor. °How to tell the difference between true labor and false labor °True labor °· Contractions last 30-70 seconds. °· Contractions become very regular. °· Discomfort is usually felt in the top of the uterus, and it spreads to the lower abdomen and low back. °· Contractions do not go away with walking. °· Contractions usually become more intense and increase in frequency. °· The cervix dilates and gets thinner. °False labor °· Contractions are usually shorter and not as strong as true labor contractions. °· Contractions are usually irregular. °· Contractions  are often felt in the front of the lower abdomen and in the groin. °· Contractions may go away when you walk around or change positions while lying down. °· Contractions get weaker and are shorter-lasting as time goes on. °· The cervix usually does not dilate or become thin. °Follow these instructions at home: °· Take over-the-counter and prescription medicines only as told by your health care provider. °· Keep up with your usual exercises and follow other instructions from your health care provider. °· Eat and drink lightly if you think you are going into labor. °· If Braxton Hicks contractions are making you uncomfortable: °? Change your position from lying down or resting to walking, or change from walking to resting. °? Sit and rest in a tub of warm water. °? Drink enough fluid to keep your urine pale yellow. Dehydration may cause these contractions. °? Do slow and deep breathing several times an hour. °· Keep all follow-up prenatal visits as told by your health care provider. This is important. °Contact a health care provider if: °· You have a fever. °· You have continuous pain in your abdomen. °Get help right away if: °· Your contractions become stronger, more regular, and closer together. °· You have fluid leaking or gushing from your vagina. °· You pass blood-tinged mucus (bloody show). °· You have bleeding from your vagina. °· You have low back pain that you never had before. °· You feel your baby’s head pushing down and causing pelvic pressure. °· Your baby is not moving inside you as much as it used to. °Summary °· Contractions that occur before labor are called Braxton   Hicks contractions, false labor, or practice contractions. °· Braxton Hicks contractions are usually shorter, weaker, farther apart, and less regular than true labor contractions. True labor contractions usually become progressively stronger and regular and they become more frequent. °· Manage discomfort from Braxton Hicks contractions by  changing position, resting in a warm bath, drinking plenty of water, or practicing deep breathing. °This information is not intended to replace advice given to you by your health care provider. Make sure you discuss any questions you have with your health care provider. °Document Released: 12/31/2016 Document Revised: 12/31/2016 Document Reviewed: 12/31/2016 °Elsevier Interactive Patient Education © 2018 [employer] ° °

## 2018-06-21 ENCOUNTER — Other Ambulatory Visit (INDEPENDENT_AMBULATORY_CARE_PROVIDER_SITE_OTHER): Payer: 59

## 2018-06-21 ENCOUNTER — Observation Stay
Admission: EM | Admit: 2018-06-21 | Discharge: 2018-06-21 | Disposition: A | Discharge status: Home / Self Care | Payer: 59 | Source: Home / Self Care | Admitting: Obstetrics and Gynecology

## 2018-06-21 ENCOUNTER — Other Ambulatory Visit: Payer: 59

## 2018-06-21 ENCOUNTER — Other Ambulatory Visit: Payer: Self-pay

## 2018-06-21 ENCOUNTER — Encounter: Payer: 59 | Admitting: Certified Nurse Midwife

## 2018-06-21 DIAGNOSIS — O99283 Endocrine, nutritional and metabolic diseases complicating pregnancy, third trimester: Secondary | ICD-10-CM | POA: Insufficient documentation

## 2018-06-21 DIAGNOSIS — O26893 Other specified pregnancy related conditions, third trimester: Secondary | ICD-10-CM

## 2018-06-21 DIAGNOSIS — Z880 Allergy status to penicillin: Secondary | ICD-10-CM | POA: Insufficient documentation

## 2018-06-21 DIAGNOSIS — O480 Post-term pregnancy: Secondary | ICD-10-CM

## 2018-06-21 DIAGNOSIS — Z79899 Other long term (current) drug therapy: Secondary | ICD-10-CM | POA: Insufficient documentation

## 2018-06-21 DIAGNOSIS — Z3A40 40 weeks gestation of pregnancy: Secondary | ICD-10-CM | POA: Insufficient documentation

## 2018-06-21 DIAGNOSIS — O479 False labor, unspecified: Secondary | ICD-10-CM

## 2018-06-21 DIAGNOSIS — E039 Hypothyroidism, unspecified: Secondary | ICD-10-CM | POA: Insufficient documentation

## 2018-06-21 MED ORDER — ZOLPIDEM TARTRATE 5 MG PO TABS
5.0000 mg | ORAL_TABLET | Freq: Every evening | ORAL | Status: DC | PRN
  Administered 2018-06-21: 5 mg via ORAL
  Filled 2018-06-21: qty 1

## 2018-06-21 NOTE — Discharge Instructions (Signed)
Fetal Movement Counts °Patient Name: ________________________________________________ Patient Due Date: ____________________ °What is a fetal movement count? °A fetal movement count is the number of times that you feel your baby move during a certain amount of time. This may also be called a fetal kick count. A fetal movement count is recommended for every pregnant woman. You may be asked to start counting fetal movements as early as week 28 of your pregnancy. °Pay attention to when your baby is most active. You may notice your baby's sleep and wake cycles. You may also notice things that make your baby move more. You should do a fetal movement count: °· When your baby is normally most active. °· At the same time each day. ° °A good time to count movements is while you are resting, after having something to eat and drink. °How do I count fetal movements? °1. Find a quiet, comfortable area. Sit, or lie down on your side. °2. Write down the date, the start time and stop time, and the number of movements that you felt between those two times. Take this information with you to your health care visits. °3. For 2 hours, count kicks, flutters, swishes, rolls, and jabs. You should feel at least 10 movements during 2 hours. °4. You may stop counting after you have felt 10 movements. °5. If you do not feel 10 movements in 2 hours, have something to eat and drink. Then, keep resting and counting for 1 hour. If you feel at least 4 movements during that hour, you may stop counting. °Contact a health care provider if: °· You feel fewer than 4 movements in 2 hours. °· Your baby is not moving like he or she usually does. °Date: ____________ Start time: ____________ Stop time: ____________ Movements: ____________ °Date: ____________ Start time: ____________ Stop time: ____________ Movements: ____________ °Date: ____________ Start time: ____________ Stop time: ____________ Movements: ____________ °Date: ____________ Start time:  ____________ Stop time: ____________ Movements: ____________ °Date: ____________ Start time: ____________ Stop time: ____________ Movements: ____________ °Date: ____________ Start time: ____________ Stop time: ____________ Movements: ____________ °Date: ____________ Start time: ____________ Stop time: ____________ Movements: ____________ °Date: ____________ Start time: ____________ Stop time: ____________ Movements: ____________ °Date: ____________ Start time: ____________ Stop time: ____________ Movements: ____________ °This information is not intended to replace advice given to you by your health care provider. Make sure you discuss any questions you have with your health care provider. °Document Released: 09/16/2006 Document Revised: 04/15/2016 Document Reviewed: 09/26/2015 °Elsevier Interactive Patient Education © 2018 Elsevier Inc[employer] °Braxton Hicks Contractions °Contractions of the uterus can occur throughout pregnancy, but they are not always a sign that you are in labor. You may have practice contractions called Braxton Hicks contractions. These false labor contractions are sometimes confused with true labor. °What are Braxton Hicks contractions? °Braxton Hicks contractions are tightening movements that occur in the muscles of the uterus before labor. Unlike true labor contractions, these contractions do not result in opening (dilation) and thinning of the cervix. Toward the end of pregnancy (32-34 weeks), Braxton Hicks contractions can happen more often and may become stronger. These contractions are sometimes difficult to tell apart from true labor because they can be very uncomfortable. You should not feel embarrassed if you go to the hospital with false labor. °Sometimes, the only way to tell if you are in true labor is for your health care provider to look for changes in the cervix. The health care provider will do a physical exam and may monitor your contractions. If   you are not in true labor, the exam  should show that your cervix is not dilating and your water has not broken. If there are other health problems associated with your pregnancy, it is completely safe for you to be sent home with false labor. You may continue to have Braxton Hicks contractions until you go into true labor. How to tell the difference between true labor and false labor True labor  Contractions last 30-70 seconds.  Contractions become very regular.  Discomfort is usually felt in the top of the uterus, and it spreads to the lower abdomen and low back.  Contractions do not go away with walking.  Contractions usually become more intense and increase in frequency.  The cervix dilates and gets thinner. False labor  Contractions are usually shorter and not as strong as true labor contractions.  Contractions are usually irregular.  Contractions are often felt in the front of the lower abdomen and in the groin.  Contractions may go away when you walk around or change positions while lying down.  Contractions get weaker and are shorter-lasting as time goes on.  The cervix usually does not dilate or become thin. Follow these instructions at home:  Take over-the-counter and prescription medicines only as told by your health care provider.  Keep up with your usual exercises and follow other instructions from your health care provider.  Eat and drink lightly if you think you are going into labor.  If Braxton Hicks contractions are making you uncomfortable: ? Change your position from lying down or resting to walking, or change from walking to resting. ? Sit and rest in a tub of warm water. ? Drink enough fluid to keep your urine pale yellow. Dehydration may cause these contractions. ? Do slow and deep breathing several times an hour.  Keep all follow-up prenatal visits as told by your health care provider. This is important. Contact a health care provider if:  You have a fever.  You have continuous pain  in your abdomen. Get help right away if:  Your contractions become stronger, more regular, and closer together.  You have fluid leaking or gushing from your vagina.  You pass blood-tinged mucus (bloody show).  You have bleeding from your vagina.  You have low back pain that you never had before.  You feel your babys head pushing down and causing pelvic pressure.  Your baby is not moving inside you as much as it used to. Summary  Contractions that occur before labor are called Braxton Hicks contractions, false labor, or practice contractions.  Braxton Hicks contractions are usually shorter, weaker, farther apart, and less regular than true labor contractions. True labor contractions usually become progressively stronger and regular and they become more frequent.  [facility] by changing position, resting in a warm bath, drinking plenty of water, or practicing deep breathing. This information is not intended to replace advice given to you by your health care provider. Make sure you discuss any questions you have with your health care provider. Document Released: 12/31/2016 Document Revised: 12/31/2016 Document Reviewed: 12/31/2016 Elsevier Interactive Patient Education  2018 [employer] Vaginal Delivery Vaginal delivery means that you will give birth by pushing your baby out of your birth canal (vagina). A team of health care providers will help you before, during, and after vaginal delivery. Birth experiences are unique for every woman and every pregnancy, and birth experiences vary depending on where you choose to give birth. What should I do  to prepare for my baby's birth? Before your baby is born, it is important to talk with your health care provider about:  Your labor and delivery preferences. These may include: ? Medicines that you may be given. ? How you will manage your pain. This might include non-medical pain relief techniques or  injectable pain relief such as epidural analgesia. ? How you and your baby will be monitored during labor and delivery. ? Who may be in the labor and delivery room with you. ? Your feelings about surgical delivery of your baby (cesarean delivery, or C-section) if this becomes necessary. ? Your feelings about receiving donated blood through an IV tube (blood transfusion) if this becomes necessary.  Whether you are able: ? To take pictures or videos of the birth. ? To eat during labor and delivery. ? To move around, walk, or change positions during labor and delivery.  What to expect after your baby is born, such as: ? Whether delayed umbilical cord clamping and cutting is offered. ? Who will care for your baby right after birth. ? Medicines or tests that may be recommended for your baby. ? Whether breastfeeding is supported in your hospital or birth center. ? How long you will be in the hospital or birth center.  How any medical conditions you have may affect your baby or your labor and delivery experience.  To prepare for your baby's birth, you should also:  Attend all of your health care visits before delivery (prenatal visits) as recommended by your health care provider. This is important.  Prepare your home for your baby's arrival. Make sure that you have: ? Diapers. ? Baby clothing. ? Feeding equipment. ? Safe sleeping arrangements for you and your baby.  Install a car seat in your vehicle. Have your car seat checked by a certified car seat installer to make sure that it is installed safely.  Think about who will help you with your new baby at home for at least the first several weeks after delivery.  What can I expect when I arrive at the birth center or hospital? Once you are in labor and have been admitted into the hospital or birth center, your health care provider may:  Review your pregnancy history and any concerns you have.  Insert an IV tube into one of your veins.  This is used to give you fluids and medicines.  Check your blood pressure, pulse, temperature, and heart rate (vital signs).  Check whether your bag of water (amniotic sac) has broken (ruptured).  Talk with you about your birth plan and discuss pain control options.  Monitoring Your health care provider may monitor your contractions (uterine monitoring) and your baby's heart rate (fetal monitoring). You may need to be monitored:  Often, but not continuously (intermittently).  All the time or for long periods at a time (continuously). Continuous monitoring may be needed if: ? You are taking certain medicines, such as medicine to relieve pain or make your contractions stronger. ? You have pregnancy or labor complications.  Monitoring may be done by:  Placing a special stethoscope or a handheld monitoring device on your abdomen to check your baby's heartbeat, and feeling your abdomen for contractions. This method of monitoring does not continuously record your baby's heartbeat or your contractions.  Placing monitors on your abdomen (external monitors) to record your baby's heartbeat and the frequency and length of contractions. You may not have to wear external monitors all the time.  Placing monitors inside of  your uterus (internal monitors) to record your baby's heartbeat and the frequency, length, and strength of your contractions. ? Your health care provider may use internal monitors if he or she needs more information about the strength of your contractions or your baby's heart rate. ? Internal monitors are put in place by passing a thin, flexible wire through your vagina and into your uterus. Depending on the type of monitor, it may remain in your uterus or on your baby's head until birth. ? Your health care provider will discuss the benefits and risks of internal monitoring with you and will ask for your permission before inserting the monitors.  Telemetry. This is a type of  continuous monitoring that can be done with external or internal monitors. Instead of having to stay in bed, you are able to move around during telemetry. Ask your health care provider if telemetry is an option for you.  Physical exam Your health care provider may perform a physical exam. This may include:  Checking whether your baby is positioned: ? With the head toward your vagina (head-down). This is most common. ? With the head toward the top of your uterus (head-up or breech). If your baby is in a breech position, your health care provider may try to turn your baby to a head-down position so you can deliver vaginally. If it does not seem that your baby can be born vaginally, your provider may recommend surgery to deliver your baby. In rare cases, you may be able to deliver vaginally if your baby is head-up (breech delivery). ? Lying sideways (transverse). Babies that are lying sideways cannot be delivered vaginally.  Checking your cervix to determine: ? Whether it is thinning out (effacing). ? Whether it is opening up (dilating). ? How low your baby has moved into your birth canal.  What are the three stages of labor and delivery?  Normal labor and delivery is divided into the following three stages: Stage 1  Stage 1 is the longest stage of labor, and it can last for hours or days. Stage 1 includes: ? Early labor. This is when contractions may be irregular, or regular and mild. Generally, early labor contractions are more than 10 minutes apart. ? Active labor. This is when contractions get longer, more regular, more frequent, and more intense. ? The transition phase. This is when contractions happen very close together, are very intense, and may last longer than during any other part of labor.  Contractions generally feel mild, infrequent, and irregular at first. They get stronger, more frequent (about every 2-3 minutes), and more regular as you progress from early labor through active  labor and transition.  Many women progress through stage 1 naturally, but you may need help to continue making progress. If this happens, your health care provider may talk with you about: ? Rupturing your amniotic sac if it has not ruptured yet. ? Giving you medicine to help make your contractions stronger and more frequent.  Stage 1 ends when your cervix is completely dilated to 4 inches (10 cm) and completely effaced. This happens at the end of the transition phase. Stage 2  Once your cervix is completely effaced and dilated to 4 inches (10 cm), you may start to feel an urge to push. It is common for the body to naturally take a rest before feeling the urge to push, especially if you received an epidural or certain other pain medicines. This rest period may last for up to 1-2 hours, depending  on your unique labor experience.  During stage 2, contractions are generally less painful, because pushing helps relieve contraction pain. Instead of contraction pain, you may feel stretching and burning pain, especially when the widest part of your baby's head passes through the vaginal opening (crowning).  Your health care provider will closely monitor your pushing progress and your baby's progress through the vagina during stage 2.  Your health care provider may massage the area of skin between your vaginal opening and anus (perineum) or apply warm compresses to your perineum. This helps it stretch as the baby's head starts to crown, which can help prevent perineal tearing. ? In some cases, an incision may be made in your perineum (episiotomy) to allow the baby to pass through the vaginal opening. An episiotomy helps to make the opening of the vagina larger to allow more room for the baby to fit through.  It is very important to breathe and focus so your health care provider can control the delivery of your baby's head. Your health care provider may have you decrease the intensity of your pushing, to  help prevent perineal tearing.  After delivery of your baby's head, the shoulders and the rest of the body generally deliver very quickly and without difficulty.  Once your baby is delivered, the umbilical cord may be cut right away, or this may be delayed for 1-2 minutes, depending on your baby's health. This may vary among health care providers, hospitals, and birth centers.  If you and your baby are healthy enough, your baby may be placed on your chest or abdomen to help maintain the baby's temperature and to help you bond with each other. Some mothers and babies start breastfeeding at this time. Your health care team will dry your baby and help keep your baby warm during this time.  Your baby may need immediate care if he or she: ? Showed signs of distress during labor. ? Has a medical condition. ? Was born too early (prematurely). ? Had a bowel movement before birth (meconium). ? Shows signs of difficulty transitioning from being inside the uterus to being outside of the uterus. If you are planning to breastfeed, your health care team will help you begin a feeding. Stage 3  The third stage of labor starts immediately after the birth of your baby and ends after you deliver the placenta. The placenta is an organ that develops during pregnancy to provide oxygen and nutrients to your baby in the womb.  Delivering the placenta may require some pushing, and you may have mild contractions. Breastfeeding can stimulate contractions to help you deliver the placenta.  After the placenta is delivered, your uterus should tighten (contract) and become firm. This helps to stop bleeding in your uterus. To help your uterus contract and to control bleeding, your health care provider may: ? Give you medicine by injection, through an IV tube, by mouth, or through your rectum (rectally). ? Massage your abdomen or perform a vaginal exam to remove any blood clots that are left in your uterus. ? Empty your  bladder by placing a thin, flexible tube (catheter) into your bladder. ? Encourage you to breastfeed your baby. After labor is over, you and your baby will be monitored closely to ensure that you are both healthy until you are ready to go home. Your health care team will teach you how to care for yourself and your baby. This information is not intended to replace advice given to you by your health  care provider. Make sure you discuss any questions you have with your health care provider. Document Released: 05/26/2008 Document Revised: 03/06/2016 Document Reviewed: 09/01/2015 Elsevier Interactive Patient Education  2018 [employer].

## 2018-06-21 NOTE — Discharge Summary (Addendum)
   Obstetric Discharge Summary  Patient ID: Adrienne Alvarado MRN: [id number] DOB/AGE: Jan 15, 1991 27 y.o.   Date of Admission: 06/21/2018  Date of Discharge:  06/21/18  Admitting Diagnosis: Observation at [redacted]w[redacted]d  Secondary Diagnosis: History of hypothyroidism in childhood     Discharge Diagnosis: No other diagnosis   Antepartum Procedures: NST    Brief Hospital Course   L&D OB Triage Note  Adrienne Alvarado is a 27 y.o. G1P0 female at [redacted]w[redacted]d, EDD Estimated Date of Delivery: 06/21/18 who presented to triage for complaints of irregular uterine contractions for the last three (3) days.  She was evaluated by myself with no significant findings for fetal distress or active labor. Vital signs stable. An NST was performed and has been reviewed by CNM. She was treated with Ambien.   NST INTERPRETATION: Indications: rule out uterine contractions  Mode: External Baseline Rate (A): 130 bpm(fht) Variability: Moderate Accelerations: 15 x 15 Decelerations: None Contraction Frequency (min): One  Impression: reactive   Plan: NST performed was reviewed and was found to be reactive. She was discharged home with bleeding/labor precautions.  Continue routine prenatal care. Follow up with CNM as previously scheduled or sooner if needed.   Discharge Instructions: Per After Visit Summary.  Activity: Also refer to After Visit Summary.   Diet: Regular  Medications:  Allergies as of 06/21/2018      Reactions   Amoxicillin    childhood   Methocarbamol Other (See Comments)   Nausea, anxiety, chills   Muscle Relief [capsaicin] Nausea And Vomiting   Penicillins Other (See Comments)   Has patient had a PCN reaction causing immediate rash, facial/tongue/throat swelling, SOB or lightheadedness with hypotension: Unknown Has patient had a PCN reaction causing severe rash involving mucus membranes or skin necrosis: Unknown Has patient had a PCN reaction that required hospitalization: Unknown Has  patient had a PCN reaction occurring within the last 10 years: Unknown If all of the above answers are "NO", then may proceed with Cephalosporin use. Childhood reaction      Medication List    TAKE these medications   docusate sodium 100 MG capsule Commonly known as:  COLACE Take 1 capsule (100 mg total) by mouth 2 (two) times daily as needed.   ondansetron 4 MG tablet Commonly known as:  ZOFRAN Take 1 tablet (4 mg total) by mouth every 8 (eight) hours as needed for nausea or vomiting.   prenatal multivitamin Tabs tablet Take 1 tablet by mouth daily at 12 noon.   promethazine 25 MG tablet Commonly known as:  PHENERGAN Take 1 tablet (25 mg total) by mouth every 6 (six) hours as needed for nausea or vomiting.      Outpatient follow up:  Follow-up Information    [facility]. Go to.   Why:  Please go to office today at 4:30 pm for AFI and growth [place].  Contact information: [address]  [address] [place] [address] [phone]       Doreene Burke, CNM. Schedule an appointment as soon as possible for a visit.   Specialties:  Certified Nurse Midwife, Radiology Why:  Please schedule NST on Friday with Doreene Burke, CNM Contact information: [address] [place] [address] [phone]           Discharged Condition: stable  Discharged to: home   Gunnar Bulla, CNM [facility] 06/21/18 12:36 PM

## 2018-06-21 NOTE — OB Triage Note (Signed)
Pt. reports experiencing non-regular contractions over the past three days.  Denies bleeding and LOF; positive for FM.  Rates pain at 5/10 during contractions.

## 2018-06-23 ENCOUNTER — Other Ambulatory Visit: Payer: Self-pay

## 2018-06-23 ENCOUNTER — Observation Stay
Admission: EM | Admit: 2018-06-23 | Discharge: 2018-06-23 | Disposition: A | Discharge status: Home / Self Care | Payer: 59 | Source: Home / Self Care | Admitting: Obstetrics and Gynecology

## 2018-06-23 ENCOUNTER — Encounter: Payer: Self-pay | Admitting: Obstetrics and Gynecology

## 2018-06-23 NOTE — OB Triage Note (Signed)
Pt presents with c/o contractions every "few minutes" for the last two hours. Pt denies vaginal bleeding, decreased fetal movement or leaking of fluid. Toco and EFM applied and explained. All questions answered. Will monitor closely.

## 2018-06-23 NOTE — OB Triage Note (Signed)
   L&D OB Triage Note  SUBJECTIVE Adrienne Alvarado is a 27 y.o. G1P0 female at [redacted]w[redacted]d, EDD Estimated Date of Delivery: 06/21/18 who presented to triage with complaints of contractions for the past few hours. She is feeling good movement and denies LOF or bleeding. .   OB History  Gravida Para Term Preterm AB Living  1 0 0 0 0 0  SAB TAB Ectopic Multiple Live Births  0 0 0 0 0    # Outcome Date GA Lbr Len/2nd Weight Sex Delivery Anes PTL Lv  1 Current             Medications Prior to Admission  Medication Sig Dispense Refill Last Dose  . docusate sodium (COLACE) 100 MG capsule Take 1 capsule (100 mg total) by mouth 2 (two) times daily as needed. 30 capsule 2 06/23/2018 at Unknown time  . ondansetron (ZOFRAN) 4 MG tablet Take 1 tablet (4 mg total) by mouth every 8 (eight) hours as needed for nausea or vomiting. 30 tablet 2 06/23/2018 at Unknown time  . Prenatal Vit-Fe Fumarate-FA (PRENATAL MULTIVITAMIN) TABS tablet Take 1 tablet by mouth daily at 12 noon.   06/23/2018 at Unknown time  . promethazine (PHENERGAN) 25 MG tablet Take 1 tablet (25 mg total) by mouth every 6 (six) hours as needed for nausea or vomiting. (Patient not taking: Reported on 06/23/2018) 30 tablet 2 Not Taking at Unknown time     OBJECTIVE  Nursing Evaluation:   BP 123/69   Pulse 73   Temp (!) 97.4 F (36.3 C) (Oral)   Resp 16   LMP 09/14/2017 (Exact Date)    Findings:  False labor  NST was performed and has been reviewed by me.  NST INTERPRETATION: Category I  Mode: External Baseline Rate (A): 120 bpm Variability: Moderate Accelerations: 15 x 15 Decelerations: None     Contraction Frequency (min): 2-6  ASSESSMENT Impression:  1.  Pregnancy:  G1P0 at [redacted]w[redacted]d , EDD Estimated Date of Delivery: 06/21/18 2.  NST:  Category I  PLAN 1. Reassurance given 2. Discharge home with standard labor precautions given to return to L&D or call the office for problems. 3. Continue routine prenatal care.Follow up in  office tomorrow as scheduled to discuss induction     Doreene Burke, CNM

## 2018-06-23 NOTE — Discharge Instructions (Signed)
Please call or return to the Birthplace at [facility] with any of the following concerns: Vaginal Bleeding Decreased Fetal Movement Leaking of Fluid Contractions every 3-5 mins/hour

## 2018-06-24 ENCOUNTER — Ambulatory Visit (INDEPENDENT_AMBULATORY_CARE_PROVIDER_SITE_OTHER): Payer: 59 | Admitting: Certified Nurse Midwife

## 2018-06-24 ENCOUNTER — Inpatient Hospital Stay
Admission: EM | Admit: 2018-06-24 | Discharge: 2018-06-26 | DRG: 807 | Disposition: A | Discharge status: Home / Self Care | Payer: 59 | Attending: Certified Nurse Midwife | Admitting: Certified Nurse Midwife

## 2018-06-24 ENCOUNTER — Other Ambulatory Visit: Payer: Self-pay

## 2018-06-24 ENCOUNTER — Inpatient Hospital Stay: Payer: 59 | Admitting: Anesthesiology

## 2018-06-24 ENCOUNTER — Other Ambulatory Visit: Payer: 59

## 2018-06-24 VITALS — BP 101/69 | HR 65 | Wt 209.6 lb

## 2018-06-24 DIAGNOSIS — Z880 Allergy status to penicillin: Secondary | ICD-10-CM

## 2018-06-24 DIAGNOSIS — O26893 Other specified pregnancy related conditions, third trimester: Secondary | ICD-10-CM | POA: Diagnosis present

## 2018-06-24 DIAGNOSIS — Z79899 Other long term (current) drug therapy: Secondary | ICD-10-CM

## 2018-06-24 DIAGNOSIS — Z3493 Encounter for supervision of normal pregnancy, unspecified, third trimester: Secondary | ICD-10-CM | POA: Diagnosis not present

## 2018-06-24 DIAGNOSIS — Z3A40 40 weeks gestation of pregnancy: Secondary | ICD-10-CM

## 2018-06-24 DIAGNOSIS — Z87891 Personal history of nicotine dependence: Secondary | ICD-10-CM

## 2018-06-24 DIAGNOSIS — Z3483 Encounter for supervision of other normal pregnancy, third trimester: Secondary | ICD-10-CM | POA: Diagnosis not present

## 2018-06-24 LAB — POCT URINALYSIS DIPSTICK OB
Bilirubin, UA: NEGATIVE
Blood, UA: NEGATIVE
GLUCOSE, UA: NEGATIVE
Ketones, UA: NEGATIVE
LEUKOCYTES UA: NEGATIVE
Nitrite, UA: NEGATIVE
POC,PROTEIN,UA: NEGATIVE
Spec Grav, UA: 1.015 (ref 1.010–1.025)
Urobilinogen, UA: 0.2 E.U./dL
pH, UA: 7 (ref 5.0–8.0)

## 2018-06-24 LAB — CBC
HEMATOCRIT: 35.6 % — AB (ref 36.0–46.0)
HEMOGLOBIN: 11.6 g/dL — AB (ref 12.0–15.0)
MCH: 29.7 pg (ref 26.0–34.0)
MCHC: 32.6 g/dL (ref 30.0–36.0)
MCV: 91.3 fL (ref 80.0–100.0)
NRBC: 0 % (ref 0.0–0.2)
Platelets: 296 10*3/uL (ref 150–400)
RBC: 3.9 MIL/uL (ref 3.87–5.11)
RDW: 13.9 % (ref 11.5–15.5)
WBC: 12.1 10*3/uL — AB (ref 4.0–10.5)

## 2018-06-24 LAB — TYPE AND SCREEN
ABO/RH(D): AB POS
ANTIBODY SCREEN: NEGATIVE

## 2018-06-24 MED ORDER — ONDANSETRON HCL 4 MG/2ML IJ SOLN: 4.0000 mg | INTRAMUSCULAR | Status: DC | PRN

## 2018-06-24 MED ORDER — METHYLERGONOVINE MALEATE 0.2 MG PO TABS: 0.2000 mg | ORAL_TABLET | ORAL | Status: DC | PRN

## 2018-06-24 MED ORDER — OXYTOCIN 10 UNIT/ML IJ SOLN: 10.0000 [IU] | Freq: Once | INTRAMUSCULAR | Status: DC

## 2018-06-24 MED ORDER — LIDOCAINE HCL (PF) 1 % IJ SOLN
30.0000 mL | INTRAMUSCULAR | Status: AC | PRN
  Administered 2018-06-24: 3 mL via SUBCUTANEOUS

## 2018-06-24 MED ORDER — ACETAMINOPHEN 325 MG PO TABS
650.0000 mg | ORAL_TABLET | ORAL | Status: DC | PRN
  Administered 2018-06-24 – 2018-06-25 (×3): 650 mg via ORAL
  Filled 2018-06-24 (×2): qty 2

## 2018-06-24 MED ORDER — PHENYLEPHRINE 40 MCG/ML (10ML) SYRINGE FOR IV PUSH (FOR BLOOD PRESSURE SUPPORT)
80.0000 ug | PREFILLED_SYRINGE | INTRAVENOUS | Status: DC | PRN
  Filled 2018-06-24: qty 5

## 2018-06-24 MED ORDER — BUPIVACAINE HCL (PF) 0.25 % IJ SOLN
INTRAMUSCULAR | Status: DC | PRN
  Administered 2018-06-24: 3 mL via EPIDURAL
  Administered 2018-06-24: 5 mL via EPIDURAL

## 2018-06-24 MED ORDER — EPHEDRINE 5 MG/ML INJ
10.0000 mg | INTRAVENOUS | Status: DC | PRN
  Filled 2018-06-24: qty 2

## 2018-06-24 MED ORDER — FENTANYL 2.5 MCG/ML W/ROPIVACAINE 0.15% IN NS 100 ML EPIDURAL (ARMC): 12.0000 mL/h | EPIDURAL | Status: DC

## 2018-06-24 MED ORDER — WITCH HAZEL-GLYCERIN EX PADS
1.0000 "application " | MEDICATED_PAD | CUTANEOUS | Status: DC | PRN
  Filled 2018-06-24 (×2): qty 100

## 2018-06-24 MED ORDER — DIBUCAINE 1 % RE OINT
1.0000 "application " | TOPICAL_OINTMENT | RECTAL | Status: DC | PRN
  Filled 2018-06-24 (×2): qty 28

## 2018-06-24 MED ORDER — SIMETHICONE 80 MG PO CHEW: 80.0000 mg | CHEWABLE_TABLET | ORAL | Status: DC | PRN

## 2018-06-24 MED ORDER — LACTATED RINGERS IV SOLN
INTRAVENOUS | Status: DC
  Administered 2018-06-24 (×2): via INTRAVENOUS

## 2018-06-24 MED ORDER — ONDANSETRON HCL 4 MG/2ML IJ SOLN
4.0000 mg | Freq: Four times a day (QID) | INTRAMUSCULAR | Status: DC | PRN
  Administered 2018-06-24: 4 mg via INTRAVENOUS
  Filled 2018-06-24: qty 2

## 2018-06-24 MED ORDER — DOCUSATE SODIUM 100 MG PO CAPS
100.0000 mg | ORAL_CAPSULE | Freq: Two times a day (BID) | ORAL | Status: DC
  Administered 2018-06-25 – 2018-06-26 (×3): 100 mg via ORAL
  Filled 2018-06-24 (×3): qty 1

## 2018-06-24 MED ORDER — SOD CITRATE-CITRIC ACID 500-334 MG/5ML PO SOLN: 30.0000 mL | ORAL | Status: DC | PRN

## 2018-06-24 MED ORDER — BENZOCAINE-MENTHOL 20-0.5 % EX AERO
1.0000 "application " | INHALATION_SPRAY | CUTANEOUS | Status: DC | PRN
  Administered 2018-06-25: 1 via TOPICAL
  Filled 2018-06-24: qty 56

## 2018-06-24 MED ORDER — COCONUT OIL OIL
1.0000 "application " | TOPICAL_OIL | Status: DC | PRN
  Administered 2018-06-25: 1 via TOPICAL
  Filled 2018-06-24: qty 120

## 2018-06-24 MED ORDER — FERROUS SULFATE 325 (65 FE) MG PO TABS
325.0000 mg | ORAL_TABLET | Freq: Every day | ORAL | Status: DC
  Administered 2018-06-25 – 2018-06-26 (×2): 325 mg via ORAL
  Filled 2018-06-24 (×2): qty 1

## 2018-06-24 MED ORDER — PRENATAL MULTIVITAMIN CH
1.0000 | ORAL_TABLET | Freq: Every day | ORAL | Status: DC
  Administered 2018-06-25 – 2018-06-26 (×2): 1 via ORAL
  Filled 2018-06-24 (×2): qty 1

## 2018-06-24 MED ORDER — AMMONIA AROMATIC IN INHA
RESPIRATORY_TRACT | Status: AC
  Filled 2018-06-24: qty 10

## 2018-06-24 MED ORDER — OXYCODONE-ACETAMINOPHEN 5-325 MG PO TABS: 1.0000 | ORAL_TABLET | ORAL | Status: DC | PRN

## 2018-06-24 MED ORDER — ACETAMINOPHEN 325 MG PO TABS
650.0000 mg | ORAL_TABLET | ORAL | Status: DC | PRN
  Administered 2018-06-25: 650 mg via ORAL
  Filled 2018-06-24 (×2): qty 2

## 2018-06-24 MED ORDER — METHYLERGONOVINE MALEATE 0.2 MG/ML IJ SOLN: 0.2000 mg | INTRAMUSCULAR | Status: DC | PRN

## 2018-06-24 MED ORDER — DIPHENHYDRAMINE HCL 50 MG/ML IJ SOLN: 12.5000 mg | INTRAMUSCULAR | Status: DC | PRN

## 2018-06-24 MED ORDER — LACTATED RINGERS IV SOLN: 500.0000 mL | INTRAVENOUS | Status: DC | PRN

## 2018-06-24 MED ORDER — OXYTOCIN BOLUS FROM INFUSION
500.0000 mL | Freq: Once | INTRAVENOUS | Status: AC
  Administered 2018-06-24: 500 mL via INTRAVENOUS

## 2018-06-24 MED ORDER — OXYTOCIN 40 UNITS IN LACTATED RINGERS INFUSION - SIMPLE MED
1.0000 m[IU]/min | INTRAVENOUS | Status: DC
  Administered 2018-06-24: 4 m[IU]/min via INTRAVENOUS
  Filled 2018-06-24: qty 1000

## 2018-06-24 MED ORDER — TERBUTALINE SULFATE 1 MG/ML IJ SOLN: 0.2500 mg | Freq: Once | INTRAMUSCULAR | Status: DC | PRN

## 2018-06-24 MED ORDER — FENTANYL 2.5 MCG/ML W/ROPIVACAINE 0.15% IN NS 100 ML EPIDURAL (ARMC)
EPIDURAL | Status: AC
  Filled 2018-06-24: qty 100

## 2018-06-24 MED ORDER — OXYTOCIN 40 UNITS IN LACTATED RINGERS INFUSION - SIMPLE MED
2.5000 [IU]/h | INTRAVENOUS | Status: DC
  Administered 2018-06-24: 2.5 [IU]/h via INTRAVENOUS

## 2018-06-24 MED ORDER — BUTORPHANOL TARTRATE 1 MG/ML IJ SOLN
1.0000 mg | INTRAMUSCULAR | Status: DC | PRN
  Administered 2018-06-24: 1 mg via INTRAVENOUS
  Filled 2018-06-24 (×2): qty 1

## 2018-06-24 MED ORDER — LIDOCAINE-EPINEPHRINE (PF) 1.5 %-1:200000 IJ SOLN
INTRAMUSCULAR | Status: DC | PRN
  Administered 2018-06-24: 4 mL via PERINEURAL

## 2018-06-24 MED ORDER — SENNOSIDES-DOCUSATE SODIUM 8.6-50 MG PO TABS
2.0000 | ORAL_TABLET | ORAL | Status: DC
  Administered 2018-06-25 – 2018-06-26 (×2): 2 via ORAL
  Filled 2018-06-24 (×2): qty 2

## 2018-06-24 MED ORDER — OXYCODONE-ACETAMINOPHEN 5-325 MG PO TABS: 2.0000 | ORAL_TABLET | ORAL | Status: DC | PRN

## 2018-06-24 MED ORDER — LACTATED RINGERS IV SOLN: 500.0000 mL | Freq: Once | INTRAVENOUS | Status: DC

## 2018-06-24 MED ORDER — ONDANSETRON HCL 4 MG PO TABS: 4.0000 mg | ORAL_TABLET | ORAL | Status: DC | PRN

## 2018-06-24 MED ORDER — MISOPROSTOL 200 MCG PO TABS
ORAL_TABLET | ORAL | Status: AC
  Filled 2018-06-24: qty 4

## 2018-06-24 MED ORDER — IBUPROFEN 600 MG PO TABS
600.0000 mg | ORAL_TABLET | Freq: Four times a day (QID) | ORAL | Status: DC
  Administered 2018-06-24 – 2018-06-26 (×7): 600 mg via ORAL
  Filled 2018-06-24 (×7): qty 1

## 2018-06-24 NOTE — Progress Notes (Signed)
ROB, NST today for post date reactive. FHT 135, accels present, Decel absent, Contractions q 5-6 min.  Pt is in early labor. Was in L&D last night and has continued to contract . SVE 3/60/-2. Pt request augmentation. Dr. Logan Bores in agreement to plan of care. PT sent to L&D .   Doreene Burke, CNM

## 2018-06-24 NOTE — Anesthesia Preprocedure Evaluation (Signed)
Anesthesia Evaluation  Patient identified by MRN, date of birth, ID band Patient awake    Reviewed: Allergy & Precautions, H&P , NPO status , Patient's Chart, lab work & pertinent test results, reviewed documented beta blocker date and time   Airway Mallampati: II  TM Distance: >3 FB Neck ROM: full    Dental no notable dental hx. (+) Teeth Intact   Pulmonary neg pulmonary ROS, Current Smoker, former smoker,    Pulmonary exam normal breath sounds clear to auscultation       Cardiovascular Exercise Tolerance: Good negative cardio ROS   Rhythm:regular Rate:Normal     Neuro/Psych negative neurological ROS  negative psych ROS   GI/Hepatic negative GI ROS, Neg liver ROS,   Endo/Other  negative endocrine ROSdiabetes  Renal/GU      Musculoskeletal   Abdominal   Peds  Hematology negative hematology ROS (+)   Anesthesia Other Findings   Reproductive/Obstetrics (+) Pregnancy                             Anesthesia Physical Anesthesia Plan  ASA: II  Anesthesia Plan: Epidural   Post-op Pain Management:    Induction:   PONV Risk Score and Plan:   Airway Management Planned:   Additional Equipment:   Intra-op Plan:   Post-operative Plan:   Informed Consent: I have reviewed the patients History and Physical, chart, labs and discussed the procedure including the risks, benefits and alternatives for the proposed anesthesia with the patient or authorized representative who has indicated his/her understanding and acceptance.       Plan Discussed with:   Anesthesia Plan Comments:         Anesthesia Quick Evaluation  

## 2018-06-24 NOTE — Patient Instructions (Signed)
Labor Induction Labor induction is when steps are taken to cause a pregnant woman to begin the labor process. Most women go into labor on their own between 37 weeks and 42 weeks of the pregnancy. When this does not happen or when there is a medical need, methods may be used to induce labor. Labor induction causes a pregnant woman's uterus to contract. It also causes the cervix to soften (ripen), open (dilate), and thin out (efface). Usually, labor is not induced before 39 weeks of the pregnancy unless there is a problem with the baby or mother. Before inducing labor, your health care provider will consider a number of factors, including the following:  The medical condition of you and the baby.  How many weeks along you are.  The status of the baby's lung maturity.  The condition of the cervix.  The position of the baby. What are the reasons for labor induction? Labor may be induced for the following reasons:  The health of the baby or mother is at risk.  The pregnancy is overdue by 1 week or more.  The water breaks but labor does not start on its own.  The mother has a health condition or serious illness, such as high blood pressure, infection, placental abruption, or diabetes.  The amniotic fluid amounts are low around the baby.  The baby is distressed. Convenience or wanting the baby to be born on a certain date is not a reason for inducing labor. What methods are used for labor induction? Several methods of labor induction may be used, such as:  Prostaglandin medicine. This medicine causes the cervix to dilate and ripen. The medicine will also start contractions. It can be taken by mouth or by inserting a suppository into the vagina.  Inserting a thin tube (catheter) with a balloon on the end into the vagina to dilate the cervix. Once inserted, the balloon is expanded with water, which causes the cervix to open.  Stripping the membranes. Your health care provider separates  amniotic sac tissue from the cervix, causing the cervix to be stretched and causing the release of a hormone called progesterone. This may cause the uterus to contract. It is often done during an office visit. You will be sent home to wait for the contractions to begin. You will then come in for an induction.  Breaking the water. Your health care provider makes a hole in the amniotic sac using a small instrument. Once the amniotic sac breaks, contractions should begin. This may still take hours to see an effect.  Medicine to trigger or strengthen contractions. This medicine is given through an IV access tube inserted into a vein in your arm. All of the methods of induction, besides stripping the membranes, will be done in the hospital. Induction is done in the hospital so that you and the baby can be carefully monitored. How long does it take for labor to be induced? Some inductions can take up to 2-3 days. Depending on the cervix, it usually takes less time. It takes longer when you are induced early in the pregnancy or if this is your first pregnancy. If a mother is still pregnant and the induction has been going on for 2-3 days, either the mother will be sent home or a cesarean delivery will be needed. What are the risks associated with labor induction? Some of the risks of induction include:  Changes in fetal heart rate, such as too high, too low, or erratic.  Fetal distress.    Chance of infection for the mother and baby.  Increased chance of having a cesarean delivery.  Breaking off (abruption) of the placenta from the uterus (rare).  Uterine rupture (very rare). When induction is needed for medical reasons, the benefits of induction may outweigh the risks. What are some reasons for not inducing labor? Labor induction should not be done if:  It is shown that your baby does not tolerate labor.  You have had previous surgeries on your uterus, such as a myomectomy or the removal of  fibroids.  Your placenta lies very low in the uterus and blocks the opening of the cervix (placenta previa).  Your baby is not in a head-down position.  The umbilical cord drops down into the birth canal in front of the baby. This could cut off the baby's blood and oxygen supply.  You have had a previous cesarean delivery.  There are unusual circumstances, such as the baby being extremely premature. This information is not intended to replace advice given to you by your health care provider. Make sure you discuss any questions you have with your health care provider. Document Released: 01/06/2007 Document Revised: 01/23/2016 Document Reviewed: 03/16/2013 Elsevier Interactive Patient Education  2017 [employer]  

## 2018-06-24 NOTE — Addendum Note (Signed)
Addended by: Mechele Claude on: 06/24/2018 10:40 AM   Modules accepted: Orders, SmartSet

## 2018-06-24 NOTE — Plan of Care (Signed)
  Problem: Education: Goal: Knowledge of condition will improve Outcome: Progressing   Problem: Activity: Goal: Will verbalize the importance of balancing activity with adequate rest periods Outcome: Progressing   

## 2018-06-24 NOTE — H&P (Signed)
History and Physical   HPI  Adrienne Alvarado is a 27 y.o. G1P0 at [redacted]w[redacted]d Estimated Date of Delivery: 06/21/18 who is being admitted for augmentation of labor. She has had prodromal contractions for the past  3 days.    OB History  OB History  Gravida Para Term Preterm AB Living  1 0 0 0 0 0  SAB TAB Ectopic Multiple Live Births  0 0 0 0 0    # Outcome Date GA Lbr Len/2nd Weight Sex Delivery Anes PTL Lv  1 Current             PROBLEM LIST  Pregnancy complications or risks: Patient Active Problem List   Diagnosis Date Noted  . Labor and delivery, indication for care 06/23/2018  . Indication for care in labor and delivery, antepartum 06/21/2018  . Pregnancy 02/26/2018    Prenatal labs and studies: ABO, Rh: AB/Positive/-- 12/18/2022 1455) Antibody: Negative 2022/12/18 1455) Rubella: <0.90 December 18, 2022 1455) RPR: Non Reactive (07/30 1026)  HBsAg: Negative 12/18/2022 1455)  HIV: Non Reactive December 18, 2022 1455)  GBS: Negative   Past Medical History:  Diagnosis Date  . Hypothyroid   . PCOS (polycystic ovarian syndrome)   . PCOS (polycystic ovarian syndrome)      Past Surgical History:  Procedure Laterality Date  . APPENDECTOMY       Medications    Current Discharge Medication List    CONTINUE these medications which have NOT CHANGED   Details  docusate sodium (COLACE) 100 MG capsule Take 1 capsule (100 mg total) by mouth 2 (two) times daily as needed. Qty: 30 capsule, Refills: 2    ondansetron (ZOFRAN) 4 MG tablet Take 1 tablet (4 mg total) by mouth every 8 (eight) hours as needed for nausea or vomiting. Qty: 30 tablet, Refills: 2    Prenatal Vit-Fe Fumarate-FA (PRENATAL MULTIVITAMIN) TABS tablet Take 1 tablet by mouth daily at 12 noon.    promethazine (PHENERGAN) 12.5 MG tablet Take 12.5 mg by mouth every 6 (six) hours as needed for nausea or vomiting.         Allergies  Amoxicillin; Methocarbamol; Muscle relief [capsaicin]; and Penicillins  Review of  Systems  Constitutional: negative Eyes: negative Ears, nose, mouth, throat, and face: negative Respiratory: negative Cardiovascular: negative Gastrointestinal: negative Genitourinary:negative Integument/breast: negative Hematologic/lymphatic: negative Musculoskeletal:negative Neurological: negative Behavioral/Psych: negative Endocrine: negative Allergic/Immunologic: negative  Physical Exam  BP 116/69 (BP Location: Left Arm)   Pulse 91   Temp 98.3 F (36.8 C) (Oral)   Resp 18   Ht 5\' 5"  (1.651 m)   Wt 94.3 kg   LMP 09/14/2017 (Exact Date)   BMI 34.61 kg/m   Lungs:  CTA B Cardio: RRR  Abd: Soft, gravid, NT Presentation: cephalic EXT: No C/C/ 1+ Edema  CERVIX: Exam by:: A Laird Runnion CNM 3/60/-2  See Prenatal records for more detailed PE.     FHR:  Baseline: 140 bpm, Variability: Good {> 6 bpm), Accelerations: Reactive and Decelerations: Absent  Toco: Uterine Contractions: irregular q 5 min.   Test Results  Results for orders placed or performed in visit on 06/24/18 (from the past 24 hour(s))  POC Urinalysis Dipstick OB     Status: None   Collection Time: 06/24/18 10:03 AM  Result Value Ref Range   Color, UA yellow    Clarity, UA clear    Glucose, UA Negative Negative   Bilirubin, UA neg    Ketones, UA neg    Spec Grav, UA 1.015 1.010 - 1.025  Blood, UA neg    pH, UA 7.0 5.0 - 8.0   POC Protein UA Negative Negative, Trace   Urobilinogen, UA 0.2 0.2 or 1.0 E.U./dL   Nitrite, UA neg    Leukocytes, UA Negative Negative   Appearance     Odor     Group B Strep negative  Assessment   G1P0 at [redacted]w[redacted]d Estimated Date of Delivery: 06/21/18  The fetus is reassuring.  Patient Active Problem List   Diagnosis Date Noted  . Labor and delivery, indication for care 06/23/2018  . Indication for care in labor and delivery, antepartum 06/21/2018  . Pregnancy 02/26/2018    Plan  1. Admit to L&D :   IV Pitocin augmentation and Arom, bloody fluid 2. EFM:--  Category 1 3. Epidural if desired.  Stadol for IV pain until epidural requested. 4. Admission labs completed 5. Anticipate NSVD  Doreene Burke, CNM  06/24/2018 1:30 PM

## 2018-06-24 NOTE — Progress Notes (Signed)
LABOR NOTE   Adrienne Alvarado 27 y.o.@ at [redacted]w[redacted]d Active phase labor.  SUBJECTIVE:  Comfortable with epidurall OBJECTIVE:  BP 112/64   Pulse 69   Temp 98.3 F (36.8 C) (Oral)   Resp 18   Ht 5\' 5"  (1.651 m)   Wt 94.3 kg   LMP 09/14/2017 (Exact Date)   SpO2 100%   BMI 34.61 kg/m  No intake/output data recorded.  She has shown cervical change. CERVIX:Per RN Exam  SVE:   Dilation: 8 Effacement (%): 60 Station: -1 Exam by:: B Darnell RN  CONTRACTIONS: regular, every 2-3 minutes FHR: Fetal heart tracing reviewed. Baseline: 125 bpm, Variability: Good {> 6 bpm), Accelerations: Non-reactive but appropriate for gestational age and Decelerations: Early Category II   Analgesia: Epidural  Labs: Lab Results  Component Value Date   WBC 12.1 (H) 06/24/2018   HGB 11.6 (L) 06/24/2018   HCT 35.6 (L) 06/24/2018   MCV 91.3 06/24/2018   PLT 296 06/24/2018    ASSESSMENT: 1) Labor curve reviewed.       Progress: Active phase labor.     Membranes: ruptured, clear fluid           Active Problems:   Labor and delivery, indication for care   PLAN: continue present management   Doreene Burke, CNM  06/24/2018 5:08 PM

## 2018-06-24 NOTE — Anesthesia Procedure Notes (Addendum)
Epidural Patient location during procedure: OB  Staffing Performed: anesthesiologist   Preanesthetic Checklist Completed: patient identified, site marked, surgical consent, pre-op evaluation, timeout performed, IV checked, risks and benefits discussed and monitors and equipment checked  Epidural Patient position: sitting Prep: Betadine Patient monitoring: heart rate, continuous pulse ox and blood pressure Approach: midline Location: L3-L4 Injection technique: LOR saline  Needle:  Needle type: Tuohy  Needle gauge: 17 G Needle length: 9 cm and 9 Needle insertion depth: 6 cm Catheter type: closed end flexible Catheter size: 19 Gauge Catheter at skin depth: 11 cm Test dose: negative and 1.5% lidocaine with Epi 1:200 K  Assessment Sensory level: T10 Events: blood not aspirated, injection not painful, no injection resistance, negative IV test and no paresthesia  Additional Notes   Patient tolerated the insertion well without complications.-SATD -IVTD. No paresthesia. Refer to [facility] nursing for VS and dosingReason for block:procedure for pain     

## 2018-06-25 LAB — CBC
HCT: 29.9 % — ABNORMAL LOW (ref 36.0–46.0)
Hemoglobin: 9.8 g/dL — ABNORMAL LOW (ref 12.0–15.0)
MCH: 29.3 pg (ref 26.0–34.0)
MCHC: 32.8 g/dL (ref 30.0–36.0)
MCV: 89.5 fL (ref 80.0–100.0)
NRBC: 0 % (ref 0.0–0.2)
Platelets: 230 10*3/uL (ref 150–400)
RBC: 3.34 MIL/uL — AB (ref 3.87–5.11)
RDW: 13.7 % (ref 11.5–15.5)
WBC: 12.4 10*3/uL — ABNORMAL HIGH (ref 4.0–10.5)

## 2018-06-25 LAB — RPR: RPR: NONREACTIVE

## 2018-06-25 NOTE — Anesthesia Postprocedure Evaluation (Signed)
Anesthesia Post Note  Patient: Adrienne Alvarado  Procedure(s) Performed: AN AD HOC LABOR EPIDURAL  Patient location during evaluation: Mother Baby Anesthesia Type: Epidural Level of consciousness: awake and alert Pain management: pain level controlled Vital Signs Assessment: post-procedure vital signs reviewed and stable Respiratory status: spontaneous breathing, nonlabored ventilation and respiratory function stable Cardiovascular status: stable Postop Assessment: no headache, no backache, patient able to bend at knees and able to ambulate Anesthetic complications: no     Last Vitals:  Vitals:   06/25/18 0100 06/25/18 0447  BP: 95/66 111/68  Pulse: 64 64  Resp: 18 17  Temp: 36.7 C 36.7 C  SpO2: 99% 99%    Last Pain:  Vitals:   06/25/18 0447  TempSrc: Oral  PainSc:                  Cleda Mccreedy Piscitello

## 2018-06-25 NOTE — Progress Notes (Signed)
Progress Note - Vaginal Delivery  Adrienne Alvarado is a 27 y.o. G1P0 now PP day 1 s/p Vaginal, Spontaneous .   Subjective:  The patient reports no complaints, up ad lib, voiding, tolerating PO and + flatus  Objective:  Vital signs in last 24 hours: Temp:  [98 F (36.7 C)-98.6 F (37 C)] 98.6 F (37 C) (10/26 0839) Pulse Rate:  [61-188] 63 (10/26 0839) Resp:  [17-18] 18 (10/26 0839) BP: (93-146)/(53-101) 99/62 (10/26 0839) SpO2:  [98 %-100 %] 98 % (10/26 0839) Weight:  [94.3 kg] 94.3 kg (10/25 1227)  Physical Exam:  General: alert, cooperative, appears stated age and fatigued Lochia: appropriate Uterine Fundus: firm DVT Evaluation: No evidence of DVT seen on physical exam. No cords or calf tenderness. No significant calf/ankle edema.    Data Review Recent Labs    06/24/18 1303 06/25/18 0511  HGB 11.6* 9.8*  HCT 35.6* 29.9*    Assessment/Plan: Active Problems:   Labor and delivery, indication for care   Plan for discharge tomorrow   -- Continue routine PP care.    Doreene Burke, CNM  06/25/2018 10:34 AM

## 2018-06-26 MED ORDER — DOCUSATE SODIUM 100 MG PO CAPS: 100.0000 mg | ORAL_CAPSULE | Freq: Two times a day (BID) | ORAL | 0 refills | Status: DC

## 2018-06-26 MED ORDER — IBUPROFEN 600 MG PO TABS: 600.0000 mg | ORAL_TABLET | Freq: Four times a day (QID) | ORAL | 0 refills | Status: DC

## 2018-06-26 NOTE — Lactation Note (Signed)
This note was copied from a baby's chart. Lactation Consultation Note  Patient Name: Adrienne Alvarado [id number] Date: 06/26/2018 Reason for consult: Follow-up assessment;Term;Primapara Observed mom breast feed.  Carter latches without assistance and begins good rhythmic sucking with occasional swallow.  Discussed tight frenulum.  Mom had tight frenulum which was clipped shortly after delivery.  FOB still has tongue tie that he never had repaired.  Information given on where to go for further evaluation and treatment if needed.  Mom and baby to be discharged today.  Mom concerned about cluster feeding.  Discussed necessity of cluster feeding to bring in mature milk and growth spurts to boost up milk supply.  Lactation community resources given and discussed for any questions, concerns or follow up.  Maternal Data Formula Feeding for Exclusion: No Has patient been taught Hand Expression?: Yes Does the patient have breastfeeding experience prior to this delivery?: No  Feeding Feeding Type: Breast Fed  LATCH Score Latch: Grasps breast easily, tongue down, lips flanged, rhythmical sucking.  Audible Swallowing: A few with stimulation  Type of Nipple: Everted at rest and after stimulation  Comfort (Breast/Nipple): Filling, red/small blisters or bruises, mild/mod discomfort  Hold (Positioning): No assistance needed to correctly position infant at breast.  LATCH Score: 8  Interventions Interventions: Breast compression;Coconut oil;Breast massage;Support pillows  Lactation Tools Discussed/Used Tools: Coconut oil;Comfort gels WIC Program: [employer])   Consult Status Consult Status: PRN Follow-up type: Call as needed    Louis Meckel 06/26/2018, 11:19 AM

## 2018-06-26 NOTE — Discharge Summary (Signed)
Discharge Summary  Date of Admission: 06/24/2018  Date of Discharge: 06/26/2018  Admitting Diagnosis: Induction of labor at [redacted]w[redacted]d  Mode of Delivery: normal spontaneous vaginal delivery                 Discharge Diagnosis: No other diagnosis   Intrapartum Procedures: Atificial rupture of membranes, epidural and laceration 2nd   Post partum procedures: none  Complications: none                      Discharge Day SOAP Note:  Progress Note - Vaginal Delivery  Adrienne Alvarado is a 27 y.o. G1P0 now PP day 2 s/p Vaginal, Spontaneous . Delivery was uncomplicated  Subjective  The patient has the following complaints: has no unusual complaints  Pain is controlled with current medications.   Patient is urinating without difficulty.  She is ambulating well.    Objective  Vital signs: BP 109/66 (BP Location: Right Arm)   Pulse 63   Temp 98.2 F (36.8 C) (Oral)   Resp 18   Ht 5\' 5"  (1.651 m)   Wt 94.3 kg   LMP 09/14/2017 (Exact Date)   SpO2 98%   Breastfeeding? Unknown   BMI 34.61 kg/m   Physical Exam: Gen: NAD Fundus Fundal Tone: Firm  Lochia Amount: Small  Perineum Appearance: Approximated     Data Review Labs: CBC Latest Ref Rng & Units 06/25/2018 06/24/2018 03/29/2018  WBC 4.0 - 10.5 K/uL 12.4(H) 12.1(H) 10.0  Hemoglobin 12.0 - 15.0 g/dL 1.6(X) 11.6(L) 11.0(L)  Hematocrit 36.0 - 46.0 % 29.9(L) 35.6(L) 34.0  Platelets 150 - 400 K/uL 230 296 298   AB POS  Assessment/Plan  Active Problems:   Labor and delivery, indication for care    Plan for discharge today.   Discharge Instructions: Per After Visit Summary. Activity: Advance as tolerated. Pelvic rest for 6 weeks.  Also refer to After Visit Summary Diet: Regular Medications: Allergies as of 06/26/2018      Reactions   Amoxicillin    childhood   Methocarbamol Other (See Comments)   Nausea, anxiety, chills   Muscle Relief [capsaicin] Nausea And Vomiting   Penicillins Other  (See Comments)   Has patient had a PCN reaction causing immediate rash, facial/tongue/throat swelling, SOB or lightheadedness with hypotension: Unknown Has patient had a PCN reaction causing severe rash involving mucus membranes or skin necrosis: Unknown Has patient had a PCN reaction that required hospitalization: Unknown Has patient had a PCN reaction occurring within the last 10 years: Unknown If all of the above answers are "NO", then may proceed with Cephalosporin use. Childhood reaction      Medication List    STOP taking these medications   ondansetron 4 MG tablet Commonly known as:  ZOFRAN   promethazine 12.5 MG tablet Commonly known as:  PHENERGAN     TAKE these medications   docusate sodium 100 MG capsule Commonly known as:  COLACE Take 1 capsule (100 mg total) by mouth 2 (two) times daily. What changed:    when to take this  reasons to take this   ibuprofen 600 MG tablet Commonly known as:  ADVIL,MOTRIN Take 1 tablet (600 mg total) by mouth every 6 (six) hours.   prenatal multivitamin Tabs tablet Take 1 tablet by mouth daily at 12 noon.  Outpatient follow up: 6 wks with Doreene Burke, CNM  Postpartum contraception: Mirena IUD  Discharged Condition: good  Discharged to: home  Newborn Data: Disposition:home with mother  Apgars: APGAR (1 MIN): 8   APGAR (5 MINS): 9   APGAR (10 MINS):    Baby Feeding: Breast    Doreene Burke, CNM  06/26/2018 9:27 AM

## 2018-06-26 NOTE — Progress Notes (Signed)
Pt discharged with infant.  Discharge instructions, prescriptions and follow up appointment given to and reviewed with pt. Pt verbalized understanding. Escorted out by auxillary. 

## 2018-06-26 NOTE — Final Progress Note (Signed)
Discharge Day SOAP Note:  Progress Note - Vaginal Delivery  Adrienne Alvarado is a 27 y.o. G1P0 now PP day 2 s/p Vaginal, Spontaneous . Delivery was uncomplicated  Subjective  The patient has the following complaints: has no unusual complaints  Pain is controlled with current medications.   Patient is urinating without difficulty.  She is ambulating well.    Objective  Vital signs: BP 109/66 (BP Location: Right Arm)   Pulse 63   Temp 98.2 F (36.8 C) (Oral)   Resp 18   Ht 5\' 5"  (1.651 m)   Wt 94.3 kg   LMP 09/14/2017 (Exact Date)   SpO2 98%   Breastfeeding? Unknown   BMI 34.61 kg/m   Physical Exam: Gen: NAD Fundus Fundal Tone: Firm  Lochia Amount: Small  Perineum Appearance: Approximated     Data Review Labs: CBC Latest Ref Rng & Units 06/25/2018 06/24/2018 03/29/2018  WBC 4.0 - 10.5 K/uL 12.4(H) 12.1(H) 10.0  Hemoglobin 12.0 - 15.0 g/dL 1.6(X) 11.6(L) 11.0(L)  Hematocrit 36.0 - 46.0 % 29.9(L) 35.6(L) 34.0  Platelets 150 - 400 K/uL 230 296 298   AB POS  Assessment/Plan  Active Problems:   Labor and delivery, indication for care    Plan for discharge today.   Discharge Instructions: Per After Visit Summary. Activity: Advance as tolerated. Pelvic rest for 6 weeks.  Also refer to After Visit Summary Diet: Regular Medications: Allergies as of 06/26/2018      Reactions   Amoxicillin    childhood   Methocarbamol Other (See Comments)   Nausea, anxiety, chills   Muscle Relief [capsaicin] Nausea And Vomiting   Penicillins Other (See Comments)   Has patient had a PCN reaction causing immediate rash, facial/tongue/throat swelling, SOB or lightheadedness with hypotension: Unknown Has patient had a PCN reaction causing severe rash involving mucus membranes or skin necrosis: Unknown Has patient had a PCN reaction that required hospitalization: Unknown Has patient had a PCN reaction occurring within the last 10 years: Unknown If all of the above  answers are "NO", then may proceed with Cephalosporin use. Childhood reaction      Medication List    STOP taking these medications   ondansetron 4 MG tablet Commonly known as:  ZOFRAN   promethazine 12.5 MG tablet Commonly known as:  PHENERGAN     TAKE these medications   docusate sodium 100 MG capsule Commonly known as:  COLACE Take 1 capsule (100 mg total) by mouth 2 (two) times daily. What changed:    when to take this  reasons to take this   ibuprofen 600 MG tablet Commonly known as:  ADVIL,MOTRIN Take 1 tablet (600 mg total) by mouth every 6 (six) hours.   prenatal multivitamin Tabs tablet Take 1 tablet by mouth daily at 12 noon.      Outpatient follow up: 6 wks with Doreene Burke, CNM  Postpartum contraception: Mirena IUD  Discharged Condition: good  Discharged to: home  Newborn Data: Disposition:home with mother  Apgars: APGAR (1 MIN): 8   APGAR (5 MINS): 9   APGAR (10 MINS):    Baby Feeding: Breast    Doreene Burke, CNM  06/26/2018 9:27 AM

## 2018-06-29 ENCOUNTER — Encounter: Payer: Self-pay | Admitting: *Deleted

## 2018-06-29 ENCOUNTER — Other Ambulatory Visit: Payer: Self-pay | Admitting: Certified Nurse Midwife

## 2018-06-29 DIAGNOSIS — K439 Ventral hernia without obstruction or gangrene: Secondary | ICD-10-CM

## 2018-06-29 NOTE — Telephone Encounter (Signed)
I will put in a referral to general surgery.  Please let her know.   Thanks,  Pattricia Boss

## 2018-07-05 ENCOUNTER — Other Ambulatory Visit: Payer: Self-pay

## 2018-07-05 ENCOUNTER — Ambulatory Visit (INDEPENDENT_AMBULATORY_CARE_PROVIDER_SITE_OTHER): Payer: 59 | Admitting: Surgery

## 2018-07-05 ENCOUNTER — Encounter: Payer: Self-pay | Admitting: Surgery

## 2018-07-05 VITALS — BP 115/77 | HR 74 | Temp 97.9°F | Resp 13 | Ht 65.0 in | Wt 199.0 lb

## 2018-07-05 DIAGNOSIS — K429 Umbilical hernia without obstruction or gangrene: Secondary | ICD-10-CM | POA: Insufficient documentation

## 2018-07-05 NOTE — Patient Instructions (Addendum)
You are scheduled for surgery at [facility] with Dr Earlene Plater on 07/11/18. You will pre admit by phone and they will be calling you. You will call the day before to get your arrival time.  Surgery instructions given.   Umbilical Hernia, Adult A hernia is a bulge of tissue that pushes through an opening between muscles. An umbilical hernia happens in the abdomen, near the belly button (umbilicus). The hernia may contain tissues from the small intestine, large intestine, or fatty tissue covering the intestines (omentum). Umbilical hernias in adults tend to get worse over time, and they require surgical treatment. There are several types of umbilical hernias. You may have:  A hernia located just above or below the umbilicus (indirect hernia). This is the most common type of umbilical hernia in adults.  A hernia that forms through an opening formed by the umbilicus (direct hernia).  A hernia that comes and goes (reducible hernia). A reducible hernia may be visible only when you strain, lift something heavy, or cough. This type of hernia can be pushed back into the abdomen (reduced).  A hernia that traps abdominal tissue inside the hernia (incarcerated hernia). This type of hernia cannot be reduced.  A hernia that cuts off blood flow to the tissues inside the hernia (strangulated hernia). The tissues can start to die if this happens. This type of hernia requires emergency treatment.  What are the causes? An umbilical hernia happens when tissue inside the abdomen presses on a weak area of the abdominal muscles. What increases the risk? You may have a greater risk of this condition if you:  Are obese.  Have had several pregnancies.  Have a buildup of fluid inside your abdomen (ascites).  Have had surgery that weakens the abdominal muscles.  What are the signs or symptoms? The main symptom of this condition is a painless bulge at or near the belly button. A reducible hernia may be visible only when you  strain, lift something heavy, or cough. Other symptoms may include:  Dull pain.  A feeling of pressure.  Symptoms of a strangulated hernia may include:  Pain that gets increasingly worse.  Nausea and vomiting.  Pain when pressing on the hernia.  Skin over the hernia becoming red or purple.  Constipation.  Blood in the stool.  How is this diagnosed? This condition may be diagnosed based on:  A physical exam. You may be asked to cough or strain while standing. These actions increase the pressure inside your abdomen and force the hernia through the opening in your muscles. Your health care provider may try to reduce the hernia by pressing on it.  Your symptoms and medical history.  How is this treated? Surgery is the only treatment for an umbilical hernia. Surgery for a strangulated hernia is done as soon as possible. If you have a small hernia that is not incarcerated, you may need to lose weight before having surgery. Follow these instructions at home:  Lose weight, if told by your health care provider.  Do not try to push the hernia back in.  Watch your hernia for any changes in color or size. Tell your health care provider if any changes occur.  You may need to avoid activities that increase pressure on your hernia.  Do not lift anything that is heavier than 10 lb (4.5 kg) until your health care provider says that this is safe.  Take over-the-counter and prescription medicines only as told by your health care provider.  Keep all  follow-up visits as told by your health care provider. This is important. Contact a health care provider if:  Your hernia gets larger.  Your hernia becomes painful. Get help right away if:  You develop sudden, severe pain near the area of your hernia.  You have pain as well as nausea or vomiting.  You have pain and the skin over your hernia changes color.  You develop a fever. This information is not intended to replace advice given  to you by your health care provider. Make sure you discuss any questions you have with your health care provider. Document Released: 01/17/2016 Document Revised: 04/19/2016 Document Reviewed: 01/17/2016 Elsevier Interactive Patient Education  [employer].

## 2018-07-05 NOTE — Progress Notes (Signed)
Surgical Clinic History and Physical  Referring provider:  Gabriel Cirri, NP [address], [place] [address]  HISTORY OF PRESENT ILLNESS (HPI):  27 y.o. recently post-partum female presents for evaluation of her umbilical hernia. Patient reports she did not previously notice any pain or bulge at her umbilicus until her recent pregnancy, during which she experienced frequent constipation with N/V. She also continued cross-fit training exercises during much of her pregnancy. She otherwise does not smoke and denies frequent coughing. While her hernia does not hurt her now, since the birth of her first son, it does "bulge" and causes her focused supraumbilical pain when she sneezes, and she expresses anxiety about resuming exercise and any future pregnancy due to her umbilical hernia. She otherwise denies any abdominal distention (post-partum), ongoing constipation, fever/chills, CP, or SOB. She requests to have her hernia repaired as soon as possible while her husband is off from work and can help her with their new baby and around the house while she recovers with anticipated post-surgical restrictions.  PAST MEDICAL HISTORY (PMH):  Past Medical History:  Diagnosis Date  . Hypothyroid   . PCOS (polycystic ovarian syndrome)   . PCOS (polycystic ovarian syndrome)      PAST SURGICAL HISTORY (PSH):  Past Surgical History:  Procedure Laterality Date  . APPENDECTOMY       MEDICATIONS:  Prior to Admission medications   Medication Sig Start Date End Date Taking? Authorizing Provider  docusate sodium (COLACE) 100 MG capsule Take 1 capsule (100 mg total) by mouth 2 (two) times daily. 06/26/18  Yes Doreene Burke, CNM  ibuprofen (ADVIL,MOTRIN) 600 MG tablet Take 1 tablet (600 mg total) by mouth every 6 (six) hours. 06/26/18  Yes Doreene Burke, CNM  Prenatal Vit-Fe Fumarate-FA (PRENATAL MULTIVITAMIN) TABS tablet Take 1 tablet by mouth daily at 12 noon.   Yes [provider]      ALLERGIES:  Allergies  Allergen Reactions  . Amoxicillin     childhood  . Methocarbamol Other (See Comments)    Nausea, anxiety, chills  . Muscle Relief [Capsaicin] Nausea And Vomiting  . Penicillins Other (See Comments)    Has patient had a PCN reaction causing immediate rash, facial/tongue/throat swelling, SOB or lightheadedness with hypotension: Unknown Has patient had a PCN reaction causing severe rash involving mucus membranes or skin necrosis: Unknown Has patient had a PCN reaction that required hospitalization: Unknown Has patient had a PCN reaction occurring within the last 10 years: Unknown If all of the above answers are "NO", then may proceed with Cephalosporin use.  Childhood reaction     SOCIAL HISTORY:  Social History   Socioeconomic History  . Marital status: Single    Spouse name: Not on file  . Number of children: Not on file  . Years of education: Not on file  . Highest education level: Not on file  Occupational History  . Not on file  Social Needs  . Financial resource strain: Not on file  . Food insecurity:    Worry: Not on file    Inability: Not on file  . Transportation needs:    Medical: Not on file    Non-medical: Not on file  Tobacco Use  . Smoking status: Former Smoker    Packs/day: 0.25    Years: 2.00    Pack years: 0.50    Types: Cigarettes    Last attempt to quit: 02/03/2016    Years since quitting: 2.4  . Smokeless tobacco: Never Used  Substance and Sexual  Activity  . Alcohol use: Not Currently  . Drug use: No  . Sexual activity: Yes    Birth control/protection: None    Comment: would like more information ( [place] )   Lifestyle  . Physical activity:    Days per week: Not on file    Minutes per session: Not on file  . Stress: Not on file  Relationships  . Social connections:    Talks on phone: Not on file    Gets together: Not on file    Attends religious service: Not on file    Active member of club or organization: Not on  file    Attends meetings of clubs or organizations: Not on file    Relationship status: Not on file  . Intimate partner violence:    Fear of current or ex partner: Not on file    Emotionally abused: Not on file    Physically abused: Not on file    Forced sexual activity: Not on file  Other Topics Concern  . Not on file  Social History Narrative   Marital status: single     Children: none     Employment: bartender    The patient currently resides (home / rehab facility / nursing home): Home The patient normally is (ambulatory / bedbound): Ambulatory  FAMILY HISTORY:  Family History  Problem Relation Age of Onset  . Cancer Mother        breast  . Diabetes Mother        type 2  . Multiple sclerosis Mother   . Breast cancer Mother   . Ovarian cancer Neg Hx   . Colon cancer Neg Hx     Otherwise negative/non-contributory.  REVIEW OF SYSTEMS:  Constitutional: denies any other weight loss, fever, chills, or sweats  Eyes: denies any other vision changes, history of eye injury  ENT: denies sore throat, hearing problems  Respiratory: denies shortness of breath, wheezing  Cardiovascular: denies chest pain, palpitations  Gastrointestinal: abdominal pain, N/V, and bowel function as per HPI Musculoskeletal: denies any other joint pains or cramps  Skin: Denies any other rashes or skin discolorations Neurological: denies any other headache, dizziness, weakness  Psychiatric: Denies any other depression, anxiety   All other review of systems were otherwise negative   VITAL SIGNS:  BP 115/77   Pulse 74   Temp 97.9 F (36.6 C) (Skin)   Resp 13   Ht 5\' 5"  (1.651 m)   Wt 199 lb (90.3 kg)   BMI 33.12 kg/m   PHYSICAL EXAM:  Constitutional:  -- Overweight (albeit post-partum) body habitus  -- Awake, alert, and oriented x3  Eyes:  -- Pupils equally round and reactive to light  -- No scleral icterus  Ear, nose, throat:  -- No jugular venous distension -- No nasal drainage,  bleeding Pulmonary:  -- No crackles  -- Equal breath sounds bilaterally -- Breathing non-labored at rest Cardiovascular:  -- S1, S2 present  -- No pericardial rubs  Gastrointestinal:  -- Focally tender to palpation, easily reducible supra-umbilical hernia (deep to piercing) -- Abdomen soft, otherwise nontender, and non-distended with no guarding/rebound tenderness  -- No other abdominal masses appreciated, pulsatile or otherwise  Musculoskeletal and Integumentary:  -- Wounds or skin discoloration: None appreciated -- Extremities: B/L UE and LE FROM, hands and feet warm  Neurologic:  -- Motor function: Intact and symmetric -- Sensation: Intact and symmetric  Labs:  CBC Latest Ref Rng & Units 06/25/2018 06/24/2018 03/29/2018  WBC 4.0 -  10.5 K/uL 12.4(H) 12.1(H) 10.0  Hemoglobin 12.0 - 15.0 g/dL 4.0(J) 11.6(L) 11.0(L)  Hematocrit 36.0 - 46.0 % 29.9(L) 35.6(L) 34.0  Platelets 150 - 400 K/uL 230 296 298   CMP Latest Ref Rng & Units 12/15/2017 11/30/2016 09/29/2016  Glucose 65 - 99 mg/dL 81 80 91  BUN 6 - 20 mg/dL 7 14 9   Creatinine 0.44 - 1.00 mg/dL 8.11 9.14 7.82  Sodium 135 - 145 mmol/L 134(L) 138 140  Potassium 3.5 - 5.1 mmol/L 3.5 3.9 4.8  Chloride 101 - 111 mmol/L 104 102 106  CO2 22 - 32 mmol/L 24 19 19   Calcium 8.9 - 10.3 mg/dL 9.3 9.2 8.9  Total Protein 6.5 - 8.1 g/dL 8.0 7.0 6.6  Total Bilirubin 0.3 - 1.2 mg/dL 0.3 0.2 0.2  Alkaline Phos 38 - 126 U/L 33(L) 44 38(L)  AST 15 - 41 U/L 22 15 26   ALT 14 - 54 U/L 17 15 20    Imaging studies: No new pertinent imaging studies available for review   Assessment/Plan: (ICD-10's: K42.9) 27 y.o. female 1.5 weeks post-partium with increasingly asymptomatic easily reducible umbilical hernia, complicated by pertinent comorbidities including obesity (BMI >33, though recent post-partum), hypothyroidism, and polycystic ovarian syndrome.               - discussed with patient signs and symptoms of hernia incarceration and obstruction              - strategies for manual self-reduction of patient's hernia also reviewed and discussed  - maintain hydration with high fiber heart healthy diet to reduce/minimize constipation +/- daily stool softener as needed             - all risks, benefits, and alternatives to, along with anticipated recovery following, open repair of supra-umbilical hernia with mesh were discussed with patient and her husband (either above level of piercing or from infra-umbilical approach with tunneling as needed), all of their questions were answered to their expressed satisfaction, patient expresses she wishes to proceed, and informed consent was obtained accordingly.             - will plan for open repair of supra-umbilical hernia with mesh as soon as possible per patient request pending anesthesia and OR availability             - anticipate return to clinic 2 weeks after above planned surgery             - instructed to call if any questions or concerns  All of the above recommendations were discussed with the patient and patient's family, and all of patient's and family's questions were answered to their expressed satisfaction.  Thank you for the opportunity to participate in this patient's care.  -- Scherrie Gerlach Earlene Plater, MD, RPVI [place]: [facility] General Surgery - Partnering for exceptional care. Office: [phone]

## 2018-07-05 NOTE — H&P (View-Only) (Signed)
Surgical Clinic History and Physical  Referring provider:  Gabriel Cirri, NP [address], [place] [address]  HISTORY OF PRESENT ILLNESS (HPI):  27 y.o. recently post-partum female presents for evaluation of her umbilical hernia. Patient reports she did not previously notice any pain or bulge at her umbilicus until her recent pregnancy, during which she experienced frequent constipation with N/V. She also continued cross-fit training exercises during much of her pregnancy. She otherwise does not smoke and denies frequent coughing. While her hernia does not hurt her now, since the birth of her first son, it does "bulge" and causes her focused supraumbilical pain when she sneezes, and she expresses anxiety about resuming exercise and any future pregnancy due to her umbilical hernia. She otherwise denies any abdominal distention (post-partum), ongoing constipation, fever/chills, CP, or SOB. She requests to have her hernia repaired as soon as possible while her husband is off from work and can help her with their new baby and around the house while she recovers with anticipated post-surgical restrictions.  PAST MEDICAL HISTORY (PMH):  Past Medical History:  Diagnosis Date  . Hypothyroid   . PCOS (polycystic ovarian syndrome)   . PCOS (polycystic ovarian syndrome)      PAST SURGICAL HISTORY (PSH):  Past Surgical History:  Procedure Laterality Date  . APPENDECTOMY       MEDICATIONS:  Prior to Admission medications   Medication Sig Start Date End Date Taking? Authorizing Provider  docusate sodium (COLACE) 100 MG capsule Take 1 capsule (100 mg total) by mouth 2 (two) times daily. 06/26/18  Yes Doreene Burke, CNM  ibuprofen (ADVIL,MOTRIN) 600 MG tablet Take 1 tablet (600 mg total) by mouth every 6 (six) hours. 06/26/18  Yes Doreene Burke, CNM  Prenatal Vit-Fe Fumarate-FA (PRENATAL MULTIVITAMIN) TABS tablet Take 1 tablet by mouth daily at 12 noon.   Yes [provider]      ALLERGIES:  Allergies  Allergen Reactions  . Amoxicillin     childhood  . Methocarbamol Other (See Comments)    Nausea, anxiety, chills  . Muscle Relief [Capsaicin] Nausea And Vomiting  . Penicillins Other (See Comments)    Has patient had a PCN reaction causing immediate rash, facial/tongue/throat swelling, SOB or lightheadedness with hypotension: Unknown Has patient had a PCN reaction causing severe rash involving mucus membranes or skin necrosis: Unknown Has patient had a PCN reaction that required hospitalization: Unknown Has patient had a PCN reaction occurring within the last 10 years: Unknown If all of the above answers are "NO", then may proceed with Cephalosporin use.  Childhood reaction     SOCIAL HISTORY:  Social History   Socioeconomic History  . Marital status: Single    Spouse name: Not on file  . Number of children: Not on file  . Years of education: Not on file  . Highest education level: Not on file  Occupational History  . Not on file  Social Needs  . Financial resource strain: Not on file  . Food insecurity:    Worry: Not on file    Inability: Not on file  . Transportation needs:    Medical: Not on file    Non-medical: Not on file  Tobacco Use  . Smoking status: Former Smoker    Packs/day: 0.25    Years: 2.00    Pack years: 0.50    Types: Cigarettes    Last attempt to quit: 02/03/2016    Years since quitting: 2.4  . Smokeless tobacco: Never Used  Substance and Sexual  Activity  . Alcohol use: Not Currently  . Drug use: No  . Sexual activity: Yes    Birth control/protection: None    Comment: would like more information ( [place] )   Lifestyle  . Physical activity:    Days per week: Not on file    Minutes per session: Not on file  . Stress: Not on file  Relationships  . Social connections:    Talks on phone: Not on file    Gets together: Not on file    Attends religious service: Not on file    Active member of club or organization: Not on  file    Attends meetings of clubs or organizations: Not on file    Relationship status: Not on file  . Intimate partner violence:    Fear of current or ex partner: Not on file    Emotionally abused: Not on file    Physically abused: Not on file    Forced sexual activity: Not on file  Other Topics Concern  . Not on file  Social History Narrative   Marital status: single     Children: none     Employment: bartender    The patient currently resides (home / rehab facility / nursing home): Home The patient normally is (ambulatory / bedbound): Ambulatory  FAMILY HISTORY:  Family History  Problem Relation Age of Onset  . Cancer Mother        breast  . Diabetes Mother        type 2  . Multiple sclerosis Mother   . Breast cancer Mother   . Ovarian cancer Neg Hx   . Colon cancer Neg Hx     Otherwise negative/non-contributory.  REVIEW OF SYSTEMS:  Constitutional: denies any other weight loss, fever, chills, or sweats  Eyes: denies any other vision changes, history of eye injury  ENT: denies sore throat, hearing problems  Respiratory: denies shortness of breath, wheezing  Cardiovascular: denies chest pain, palpitations  Gastrointestinal: abdominal pain, N/V, and bowel function as per HPI Musculoskeletal: denies any other joint pains or cramps  Skin: Denies any other rashes or skin discolorations Neurological: denies any other headache, dizziness, weakness  Psychiatric: Denies any other depression, anxiety   All other review of systems were otherwise negative   VITAL SIGNS:  BP 115/77   Pulse 74   Temp 97.9 F (36.6 C) (Skin)   Resp 13   Ht 5\' 5"  (1.651 m)   Wt 199 lb (90.3 kg)   BMI 33.12 kg/m   PHYSICAL EXAM:  Constitutional:  -- Overweight (albeit post-partum) body habitus  -- Awake, alert, and oriented x3  Eyes:  -- Pupils equally round and reactive to light  -- No scleral icterus  Ear, nose, throat:  -- No jugular venous distension -- No nasal drainage,  bleeding Pulmonary:  -- No crackles  -- Equal breath sounds bilaterally -- Breathing non-labored at rest Cardiovascular:  -- S1, S2 present  -- No pericardial rubs  Gastrointestinal:  -- Focally tender to palpation, easily reducible supra-umbilical hernia (deep to piercing) -- Abdomen soft, otherwise nontender, and non-distended with no guarding/rebound tenderness  -- No other abdominal masses appreciated, pulsatile or otherwise  Musculoskeletal and Integumentary:  -- Wounds or skin discoloration: None appreciated -- Extremities: B/L UE and LE FROM, hands and feet warm  Neurologic:  -- Motor function: Intact and symmetric -- Sensation: Intact and symmetric  Labs:  CBC Latest Ref Rng & Units 06/25/2018 06/24/2018 03/29/2018  WBC 4.0 -  10.5 K/uL 12.4(H) 12.1(H) 10.0  Hemoglobin 12.0 - 15.0 g/dL 1.6(X) 11.6(L) 11.0(L)  Hematocrit 36.0 - 46.0 % 29.9(L) 35.6(L) 34.0  Platelets 150 - 400 K/uL 230 296 298   CMP Latest Ref Rng & Units 12/15/2017 11/30/2016 09/29/2016  Glucose 65 - 99 mg/dL 81 80 91  BUN 6 - 20 mg/dL 7 14 9   Creatinine 0.44 - 1.00 mg/dL 0.96 0.45 4.09  Sodium 135 - 145 mmol/L 134(L) 138 140  Potassium 3.5 - 5.1 mmol/L 3.5 3.9 4.8  Chloride 101 - 111 mmol/L 104 102 106  CO2 22 - 32 mmol/L 24 19 19   Calcium 8.9 - 10.3 mg/dL 9.3 9.2 8.9  Total Protein 6.5 - 8.1 g/dL 8.0 7.0 6.6  Total Bilirubin 0.3 - 1.2 mg/dL 0.3 0.2 0.2  Alkaline Phos 38 - 126 U/L 33(L) 44 38(L)  AST 15 - 41 U/L 22 15 26   ALT 14 - 54 U/L 17 15 20    Imaging studies: No new pertinent imaging studies available for review   Assessment/Plan: (ICD-10's: K42.9) 27 y.o. female 1.5 weeks post-partium with increasingly asymptomatic easily reducible umbilical hernia, complicated by pertinent comorbidities including obesity (BMI >33, though recent post-partum), hypothyroidism, and polycystic ovarian syndrome.               - discussed with patient signs and symptoms of hernia incarceration and obstruction              - strategies for manual self-reduction of patient's hernia also reviewed and discussed  - maintain hydration with high fiber heart healthy diet to reduce/minimize constipation +/- daily stool softener as needed             - all risks, benefits, and alternatives to, along with anticipated recovery following, open repair of supra-umbilical hernia with mesh were discussed with patient and her husband (either above level of piercing or from infra-umbilical approach with tunneling as needed), all of their questions were answered to their expressed satisfaction, patient expresses she wishes to proceed, and informed consent was obtained accordingly.             - will plan for open repair of supra-umbilical hernia with mesh as soon as possible per patient request pending anesthesia and OR availability             - anticipate return to clinic 2 weeks after above planned surgery             - instructed to call if any questions or concerns  All of the above recommendations were discussed with the patient and patient's family, and all of patient's and family's questions were answered to their expressed satisfaction.  Thank you for the opportunity to participate in this patient's care.  -- Scherrie Gerlach Earlene Plater, MD, RPVI [place]: [facility] General Surgery - Partnering for exceptional care. Office: [phone]

## 2018-07-07 ENCOUNTER — Encounter
Admission: RE | Admit: 2018-07-07 | Discharge: 2018-07-07 | Disposition: A | Discharge status: Home / Self Care | Payer: 59 | Source: Ambulatory Visit | Attending: Surgery | Admitting: Surgery

## 2018-07-07 ENCOUNTER — Other Ambulatory Visit: Payer: Self-pay

## 2018-07-07 HISTORY — DX: Other specified postprocedural states: R11.2

## 2018-07-07 HISTORY — DX: Umbilical hernia without obstruction or gangrene: K42.9

## 2018-07-07 HISTORY — DX: Other specified postprocedural states: Z98.890

## 2018-07-07 NOTE — Patient Instructions (Signed)
Your procedure is scheduled on: Monday, July 11, 2018 Report to Day Surgery on the 2nd floor of the [facility] To find out your arrival time, please call [phone] between 1PM - 3PM on: Friday, July 08, 2018  REMEMBER: Instructions that are not followed completely may result in serious medical risk, up to and including death; or upon the discretion of your surgeon and anesthesiologist your surgery may need to be rescheduled.  Do not eat food after midnight the night before surgery.  No gum chewing, lozengers or hard candies.  You may however, drink CLEAR liquids up to 2 hours before you are scheduled to arrive for your surgery. Do not drink anything within 2 hours of the start of your surgery.  Clear liquids include: - water  - apple juice without pulp - gatorade - black coffee or tea (Do NOT add milk or creamers to the coffee or tea) Do NOT drink anything that is not on this list.  No Alcohol for 24 hours before or after surgery.  No Smoking including e-cigarettes for 24 hours prior to surgery.  No chewable tobacco products for at least 6 hours prior to surgery.  No nicotine patches on the day of surgery.  On the morning of surgery brush your teeth with toothpaste and water, you may rinse your mouth with mouthwash if you wish. Do not swallow any toothpaste or mouthwash.  Notify your doctor if there is any change in your medical condition (cold, fever, infection).  Do not wear jewelry, make-up, hairpins, clips or nail polish.  Do not wear lotions, powders, or perfumes.   Do not shave 48 hours prior to surgery.   Contacts and dentures may not be worn into surgery.  Do not bring valuables to the hospital, including drivers license, insurance or credit cards.  [facility] is not responsible for any belongings or valuables.   TAKE THESE MEDICATIONS THE MORNING OF SURGERY:  none  Shower with an antibacterial soap prior to surgery to help prevent infection.  Now!   Stop Anti-inflammatories (NSAIDS) such as Advil, Aleve, Ibuprofen, Motrin, Naproxen, Naprosyn and Aspirin based products such as Excedrin, Goodys Powder, BC Powder. (May take Tylenol or Acetaminophen if needed.)  Now!  Stop ANY OVER THE COUNTER supplements until after surgery. (May continue multivitamin.)  Wear comfortable clothing (specific to your surgery type) to the hospital.  Plan for stool softeners for home use.  If you are being discharged the day of surgery, you will not be allowed to drive home. You will need a responsible adult to drive you home and stay with you that night.   If you are taking public transportation, you will need to have a responsible adult with you. Please confirm with your physician that it is acceptable to use public transportation.   Please call [phone] if you have any questions about these instructions.

## 2018-07-10 MED ORDER — CEFAZOLIN SODIUM-DEXTROSE 2-4 GM/100ML-% IV SOLN
2.0000 g | INTRAVENOUS | Status: AC
  Administered 2018-07-11: 2 g via INTRAVENOUS

## 2018-07-11 ENCOUNTER — Ambulatory Visit
Admission: RE | Admit: 2018-07-11 | Discharge: 2018-07-11 | Disposition: A | Discharge status: Home / Self Care | Payer: 59 | Source: Ambulatory Visit | Attending: Surgery | Admitting: Surgery

## 2018-07-11 ENCOUNTER — Encounter: Payer: Self-pay | Admitting: *Deleted

## 2018-07-11 ENCOUNTER — Encounter
Admission: RE | Disposition: A | Discharge status: Home / Self Care | Payer: Self-pay | Source: Ambulatory Visit | Attending: Surgery

## 2018-07-11 ENCOUNTER — Other Ambulatory Visit: Payer: Self-pay

## 2018-07-11 ENCOUNTER — Ambulatory Visit: Payer: 59 | Admitting: Certified Registered Nurse Anesthetist

## 2018-07-11 DIAGNOSIS — E039 Hypothyroidism, unspecified: Secondary | ICD-10-CM | POA: Insufficient documentation

## 2018-07-11 DIAGNOSIS — Z87891 Personal history of nicotine dependence: Secondary | ICD-10-CM | POA: Diagnosis not present

## 2018-07-11 DIAGNOSIS — Z79899 Other long term (current) drug therapy: Secondary | ICD-10-CM | POA: Diagnosis not present

## 2018-07-11 DIAGNOSIS — Z880 Allergy status to penicillin: Secondary | ICD-10-CM | POA: Diagnosis not present

## 2018-07-11 DIAGNOSIS — E282 Polycystic ovarian syndrome: Secondary | ICD-10-CM | POA: Diagnosis not present

## 2018-07-11 DIAGNOSIS — K429 Umbilical hernia without obstruction or gangrene: Secondary | ICD-10-CM | POA: Diagnosis not present

## 2018-07-11 HISTORY — PX: UMBILICAL HERNIA REPAIR: SHX196

## 2018-07-11 LAB — HEMOGLOBIN AND HEMATOCRIT, BLOOD
HEMATOCRIT: 36.9 % (ref 36.0–46.0)
Hemoglobin: 11.8 g/dL — ABNORMAL LOW (ref 12.0–15.0)

## 2018-07-11 LAB — POCT PREGNANCY, URINE: PREG TEST UR: NEGATIVE

## 2018-07-11 SURGERY — REPAIR, HERNIA, UMBILICAL, ADULT
Anesthesia: General | Site: Abdomen

## 2018-07-11 MED ORDER — OXYCODONE-ACETAMINOPHEN 5-325 MG PO TABS
1.0000 | ORAL_TABLET | ORAL | Status: DC | PRN
  Administered 2018-07-11: 1 via ORAL

## 2018-07-11 MED ORDER — FENTANYL CITRATE (PF) 100 MCG/2ML IJ SOLN
INTRAMUSCULAR | Status: DC | PRN
  Administered 2018-07-11 (×2): 50 ug via INTRAVENOUS

## 2018-07-11 MED ORDER — ROCURONIUM BROMIDE 50 MG/5ML IV SOLN
INTRAVENOUS | Status: AC
  Filled 2018-07-11: qty 1

## 2018-07-11 MED ORDER — LIDOCAINE HCL (PF) 2 % IJ SOLN
INTRAMUSCULAR | Status: AC
  Filled 2018-07-11: qty 10

## 2018-07-11 MED ORDER — LIDOCAINE HCL (CARDIAC) PF 100 MG/5ML IV SOSY
PREFILLED_SYRINGE | INTRAVENOUS | Status: DC | PRN
  Administered 2018-07-11: 80 mg via INTRAVENOUS

## 2018-07-11 MED ORDER — LACTATED RINGERS IV SOLN
INTRAVENOUS | Status: DC
  Administered 2018-07-11 (×2): via INTRAVENOUS

## 2018-07-11 MED ORDER — MIDAZOLAM HCL 2 MG/2ML IJ SOLN
INTRAMUSCULAR | Status: DC | PRN
  Administered 2018-07-11: 2 mg via INTRAVENOUS

## 2018-07-11 MED ORDER — CHLORHEXIDINE GLUCONATE CLOTH 2 % EX PADS: 6.0000 | MEDICATED_PAD | Freq: Once | CUTANEOUS | Status: DC

## 2018-07-11 MED ORDER — SCOPOLAMINE 1 MG/3DAYS TD PT72
1.0000 | MEDICATED_PATCH | Freq: Once | TRANSDERMAL | Status: DC
  Administered 2018-07-11: 1.5 mg via TRANSDERMAL

## 2018-07-11 MED ORDER — FENTANYL CITRATE (PF) 100 MCG/2ML IJ SOLN
25.0000 ug | INTRAMUSCULAR | Status: DC | PRN
  Administered 2018-07-11 (×2): 25 ug via INTRAVENOUS

## 2018-07-11 MED ORDER — ROCURONIUM BROMIDE 100 MG/10ML IV SOLN
INTRAVENOUS | Status: DC | PRN
  Administered 2018-07-11: 50 mg via INTRAVENOUS

## 2018-07-11 MED ORDER — FENTANYL CITRATE (PF) 100 MCG/2ML IJ SOLN
INTRAMUSCULAR | Status: AC
  Administered 2018-07-11: 25 ug via INTRAVENOUS
  Filled 2018-07-11: qty 2

## 2018-07-11 MED ORDER — SUGAMMADEX SODIUM 200 MG/2ML IV SOLN
INTRAVENOUS | Status: DC | PRN
  Administered 2018-07-11: 200 mg via INTRAVENOUS

## 2018-07-11 MED ORDER — FAMOTIDINE 20 MG PO TABS
20.0000 mg | ORAL_TABLET | Freq: Once | ORAL | Status: AC
  Administered 2018-07-11: 20 mg via ORAL

## 2018-07-11 MED ORDER — PROMETHAZINE HCL 25 MG/ML IJ SOLN: 6.2500 mg | INTRAMUSCULAR | Status: DC | PRN

## 2018-07-11 MED ORDER — DEXAMETHASONE SODIUM PHOSPHATE 10 MG/ML IJ SOLN
INTRAMUSCULAR | Status: AC
  Filled 2018-07-11: qty 1

## 2018-07-11 MED ORDER — OXYCODONE HCL 5 MG PO TABS
5.0000 mg | ORAL_TABLET | Freq: Once | ORAL | Status: AC | PRN
  Administered 2018-07-11: 5 mg via ORAL

## 2018-07-11 MED ORDER — PROPOFOL 10 MG/ML IV BOLUS
INTRAVENOUS | Status: DC | PRN
  Administered 2018-07-11: 150 mg via INTRAVENOUS

## 2018-07-11 MED ORDER — OXYCODONE HCL 5 MG/5ML PO SOLN: 5.0000 mg | Freq: Once | ORAL | Status: AC | PRN

## 2018-07-11 MED ORDER — ONDANSETRON HCL 4 MG/2ML IJ SOLN
INTRAMUSCULAR | Status: DC | PRN
  Administered 2018-07-11: 4 mg via INTRAVENOUS

## 2018-07-11 MED ORDER — FENTANYL CITRATE (PF) 100 MCG/2ML IJ SOLN
INTRAMUSCULAR | Status: AC
  Filled 2018-07-11: qty 2

## 2018-07-11 MED ORDER — PROPOFOL 10 MG/ML IV BOLUS
INTRAVENOUS | Status: AC
  Filled 2018-07-11: qty 20

## 2018-07-11 MED ORDER — OXYCODONE HCL 5 MG PO TABS
ORAL_TABLET | ORAL | Status: AC
  Administered 2018-07-11: 5 mg via ORAL
  Filled 2018-07-11: qty 1

## 2018-07-11 MED ORDER — MEPERIDINE HCL 50 MG/ML IJ SOLN: 6.2500 mg | INTRAMUSCULAR | Status: DC | PRN

## 2018-07-11 MED ORDER — DEXAMETHASONE SODIUM PHOSPHATE 10 MG/ML IJ SOLN
INTRAMUSCULAR | Status: DC | PRN
  Administered 2018-07-11: 10 mg via INTRAVENOUS

## 2018-07-11 MED ORDER — LIDOCAINE HCL 1 % IJ SOLN
INTRAMUSCULAR | Status: DC | PRN
  Administered 2018-07-11: 20 mL

## 2018-07-11 MED ORDER — ONDANSETRON HCL 4 MG/2ML IJ SOLN
INTRAMUSCULAR | Status: AC
  Filled 2018-07-11: qty 2

## 2018-07-11 MED ORDER — OXYCODONE-ACETAMINOPHEN 5-325 MG PO TABS: 1.0000 | ORAL_TABLET | ORAL | 0 refills | Status: DC | PRN

## 2018-07-11 MED ORDER — ACETAMINOPHEN 500 MG PO TABS
1000.0000 mg | ORAL_TABLET | ORAL | Status: AC
  Administered 2018-07-11: 1000 mg via ORAL

## 2018-07-11 MED ORDER — MIDAZOLAM HCL 2 MG/2ML IJ SOLN
INTRAMUSCULAR | Status: AC
  Filled 2018-07-11: qty 2

## 2018-07-11 MED ORDER — SCOPOLAMINE 1 MG/3DAYS TD PT72
MEDICATED_PATCH | TRANSDERMAL | Status: AC
  Administered 2018-07-11: 1.5 mg via TRANSDERMAL
  Filled 2018-07-11: qty 1

## 2018-07-11 SURGICAL SUPPLY — 30 items
BLADE CLIPPER SURG (BLADE) ×2 IMPLANT
BLADE SURG 15 STRL LF DISP TIS (BLADE) ×1 IMPLANT
BLADE SURG 15 STRL SS (BLADE) ×1
CHLORAPREP W/TINT 26ML (MISCELLANEOUS) ×2 IMPLANT
COVER WAND RF STERILE (DRAPES) ×2 IMPLANT
DECANTER SPIKE VIAL GLASS SM (MISCELLANEOUS) ×4 IMPLANT
DERMABOND ADVANCED (GAUZE/BANDAGES/DRESSINGS) ×1
DERMABOND ADVANCED .7 DNX12 (GAUZE/BANDAGES/DRESSINGS) ×1 IMPLANT
DRAIN PENROSE 1/4X12 LTX (DRAIN) IMPLANT
DRAPE LAPAROTOMY 77X122 PED (DRAPES) ×2 IMPLANT
ELECT CAUTERY BLADE 6.4 (BLADE) ×2 IMPLANT
ELECT REM PT RETURN 9FT ADLT (ELECTROSURGICAL) ×2
ELECTRODE REM PT RTRN 9FT ADLT (ELECTROSURGICAL) ×1 IMPLANT
GLOVE BIO SURGEON STRL SZ7 (GLOVE) ×2 IMPLANT
GLOVE BIOGEL PI IND STRL 7.5 (GLOVE) ×1 IMPLANT
GLOVE BIOGEL PI INDICATOR 7.5 (GLOVE) ×1
GOWN STRL REUS W/ TWL LRG LVL3 (GOWN DISPOSABLE) ×2 IMPLANT
GOWN STRL REUS W/TWL LRG LVL3 (GOWN DISPOSABLE) ×2
KIT TURNOVER KIT A (KITS) ×2 IMPLANT
MESH VENTRALEX ST 1-7/10 CRC S (Mesh General) ×1 IMPLANT
NEEDLE HYPO 22GX1.5 SAFETY (NEEDLE) ×2 IMPLANT
NS IRRIG 1000ML POUR BTL (IV SOLUTION) ×2 IMPLANT
PACK BASIN MINOR ARMC (MISCELLANEOUS) ×2 IMPLANT
SUT ETHIBOND 0 MO6 C/R (SUTURE) ×2 IMPLANT
SUT MNCRL AB 4-0 PS2 18 (SUTURE) ×2 IMPLANT
SUT VIC AB 2-0 SH 27 (SUTURE) ×1
SUT VIC AB 2-0 SH 27XBRD (SUTURE) ×1 IMPLANT
SUT VIC AB 3-0 SH 27 (SUTURE) ×1
SUT VIC AB 3-0 SH 27X BRD (SUTURE) ×1 IMPLANT
SYR 10ML LL (SYRINGE) ×2 IMPLANT

## 2018-07-11 NOTE — Anesthesia Post-op Follow-up Note (Signed)
Anesthesia QCDR form completed.        

## 2018-07-11 NOTE — Anesthesia Postprocedure Evaluation (Signed)
Anesthesia Post Note  Patient: Adrienne Alvarado  Procedure(s) Performed: HERNIA REPAIR UMBILICAL ADULT WITH MESH (N/A Abdomen)  Patient location during evaluation: PACU Anesthesia Type: General Level of consciousness: awake and alert and oriented Pain management: pain level controlled Vital Signs Assessment: post-procedure vital signs reviewed and stable Respiratory status: spontaneous breathing, nonlabored ventilation and respiratory function stable Cardiovascular status: blood pressure returned to baseline and stable Postop Assessment: no signs of nausea or vomiting Anesthetic complications: no     Last Vitals:  Vitals:   07/11/18 1130 07/11/18 1200  BP: 115/69 106/62  Pulse: 60 63  Resp: 14 14  Temp: 36.4 C 36.5 C  SpO2: 99% 99%    Last Pain:  Vitals:   07/11/18 1200  TempSrc: Temporal  PainSc: 2                  Jastin Fore

## 2018-07-11 NOTE — Progress Notes (Signed)
Per patient request, I spoke with Dr. Priscella Mann to confirm it is acceptable for patient to take roxicodone as she is currently breast feeding. Per MD, may give this medication to patient.

## 2018-07-11 NOTE — Interval H&P Note (Signed)
History and Physical Interval Note:  07/11/2018 8:58 AM  Adrienne Alvarado  has presented today for surgery, with the diagnosis of umbilical hernia  The various methods of treatment have been discussed with the patient and family. After consideration of risks, benefits and other options for treatment, the patient has consented to  Procedure(s): HERNIA REPAIR UMBILICAL ADULT WITH MESH (N/A) as a surgical intervention .  The patient's history has been reviewed, patient examined, no change in status, stable for surgery.  I have reviewed the patient's chart and labs.  Questions were answered to the patient's satisfaction.     Ancil Linsey

## 2018-07-11 NOTE — Op Note (Signed)
SURGICAL OPERATIVE REPORT  DATE OF PROCEDURE: 07/11/2018  ATTENDING Surgeon(s): Ancil Linsey, MD  ANESTHESIA: GETA  PRE-OPERATIVE DIAGNOSIS: Increasingly symptomatic (painful) reducible umbilical hernia (icd-10: K42.9)  POST-OPERATIVE DIAGNOSIS: Increasingly symptomatic (painful) reducible umbilical hernia (icd-10: K42.9)  PROCEDURE(S):  1.) Open repair of increasingly symptomatic (painful) umbilical hernia with mesh (cpt: 16109)  INTRAOPERATIVE FINDINGS: 1.5 cm umbilical hernia with adherent omentum and hernia sac  INTRAVENOUS FLUIDS: 1000 mL crystalloid   ESTIMATED BLOOD LOSS: Minimal (<20 mL)   URINE OUTPUT: No Foley catheter  SPECIMENS: None  IMPLANTS: 4.3 cm Bard Ventralex ST ventral hernia repair mesh  DRAINS: None  COMPLICATIONS: None apparent  CONDITION AT END OF PROCEDURE: Hemodynamically stable and extubated  DISPOSITION OF PATIENT: PACU  INDICATIONS FOR PROCEDURE:  Patient is a 27 y.o. postpartum female who presented upon referral from her PMD for evaluation of patient's umbilical hernia. She denied change in bowel function or N/V and likewise denied constipation (except during pregnancy), straining with urination, or frequent coughing and expressed that she'd like to have it repaired. All risks, benefits, and alternatives to above elective procedure were discussed with the patient, all of patient's questions were answered to her expressed satisfaction, and informed consent was obtained and documented accordingly.  DETAILS OF PROCEDURE: Patient was brought to the operating suite and appropriately identified. General anesthesia was administered along with appropriate pre-operative antibiotics, and endotracheal intubation was performed by anesthetist. In supine position, operative site was prepped and draped in the usual sterile fashion, and following a brief time out, local anesthetic was injected inferior to the umbilicus, and a 3 cm transverse curvilinear  incision was made using a #15 blade scalpel and extended deep through subcutaneous tissues using blunt dissection and electrocautery. The hernia sac was then dissected from the undersurface of the umbilical skin and stalk, and the fascial defect was cleared of all omental adhesions. A 4.3 cm Bard Ventralex ST mesh patch was selected, inserted, confirmed to approximate well to fascial edges without intervening viscera or omentum, and patch was secured without tension using Ethibond braided non-absorbable suture, which was also used to re-approximate fascia over secured hernia mesh. The umbilicus was then invaginated using 2-0 Vicryl suture from the dermis to fascia, and the wound was irrigated using sterile saline. Overlying tissues were re-approximated using buried interrupted 3-0 Vicryl suture. Skin was cleaned and dried, and sterile Dermabond skin glue was applied and allowed to dry.  Patient was then safely able to be extubated, awakened, and transferred to PACU for post-operative monitoring and care.  I was present for all aspects of the above procedure, and no operative complications were apparent.

## 2018-07-11 NOTE — Anesthesia Procedure Notes (Signed)
Procedure Name: Intubation Date/Time: 07/11/2018 9:29 AM Performed by: Ginger Carne, CRNA Pre-anesthesia Checklist: Patient identified, Emergency Drugs available, Suction available, Patient being monitored and Timeout performed Patient Re-evaluated:Patient Re-evaluated prior to induction Oxygen Delivery Method: Circle system utilized Preoxygenation: Pre-oxygenation with 100% oxygen Induction Type: IV induction Ventilation: Mask ventilation without difficulty Laryngoscope Size: Miller and 2 Grade View: Grade I Tube type: Oral Tube size: 7.0 mm Airway Equipment and Method: Stylet Placement Confirmation: ETT inserted through vocal cords under direct vision,  positive ETCO2 and breath sounds checked- equal and bilateral Secured at: 21 cm Tube secured with: Tape Dental Injury: Teeth and Oropharynx as per pre-operative assessment

## 2018-07-11 NOTE — Discharge Instructions (Addendum)
In addition to included general post-operative instructions for open Repair of Umbilical Hernia with mesh,  Diet: Resume home heart healthy diet.   Activity: No heavy lifting >15 - 20 pounds (children, pets, laundry, garbage) or strenuous activity until follow-up, but light activity and walking are encouraged. Do not drive or drink alcohol if taking narcotic pain medications.  Wound care: 2 days after surgery (Wednesday, 11/13), you may shower/get incision wet with soapy water and pat dry (do not rub incisions), but no baths or submerging incision underwater until follow-up.   Medications: Resume all home medications. For mild to moderate pain: acetaminophen (Tylenol) or ibuprofen/naproxen (if no kidney disease). Combining Tylenol with alcohol can substantially increase your risk of causing liver disease. Narcotic pain medications, if prescribed, can be used for severe pain, though may cause nausea, constipation, and drowsiness. Do not combine Tylenol and Percocet (or similar) within a 6 hour period as Percocet (and similar) contain(s) Tylenol. If you do not need the narcotic pain medication, you do not need to fill the prescription.  Call office [phone]) at any time if any questions, worsening pain, fevers/chills, bleeding, drainage from incision site, or other concerns.    AMBULATORY SURGERY  DISCHARGE INSTRUCTIONS   1) The drugs that you were given will stay in your system until tomorrow so for the next 24 hours you should not:  A) Drive an automobile B) Make any legal decisions C) Drink any alcoholic beverage   2) You may resume regular meals tomorrow.  Today it is better to start with liquids and gradually work up to solid foods.  You may eat anything you prefer, but it is better to start with liquids, then soup and crackers, and gradually work up to solid foods.   3) Please notify your doctor immediately if you have any unusual bleeding, trouble breathing, redness and pain  at the surgery site, drainage, fever, or pain not relieved by medication.    4) Additional Instructions:        Please contact your physician with any problems or Same Day Surgery at [phone], Monday through Friday 6 am to 4 pm, or  at [facility] number at [phone].

## 2018-07-11 NOTE — Anesthesia Preprocedure Evaluation (Signed)
Anesthesia Evaluation  Patient identified by MRN, date of birth, ID band Patient awake    Reviewed: Allergy & Precautions, NPO status , Patient's Chart, lab work & pertinent test results  History of Anesthesia Complications (+) PONV and history of anesthetic complications  Airway Mallampati: II  TM Distance: >3 FB Neck ROM: Full    Dental no notable dental hx.    Pulmonary neg sleep apnea, neg COPD, former smoker,    breath sounds clear to auscultation- rhonchi (-) wheezing      Cardiovascular Exercise Tolerance: Good (-) hypertension(-) CAD, (-) Past MI, (-) Cardiac Stents and (-) CABG  Rhythm:Regular Rate:Normal - Systolic murmurs and - Diastolic murmurs    Neuro/Psych negative neurological ROS  negative psych ROS   GI/Hepatic negative GI ROS, Neg liver ROS,   Endo/Other  neg diabetesHypothyroidism   Renal/GU negative Renal ROS     Musculoskeletal negative musculoskeletal ROS (+)   Abdominal (+) + obese,   Peds  Hematology negative hematology ROS (+)   Anesthesia Other Findings Past Medical History: No date: Hypothyroid No date: PCOS (polycystic ovarian syndrome) No date: PCOS (polycystic ovarian syndrome) No date: PONV (postoperative nausea and vomiting) 02/26/2018: Pregnancy No date: Umbilical hernia   Reproductive/Obstetrics (+) Breast feeding                              Anesthesia Physical Anesthesia Plan  ASA: II  Anesthesia Plan: General   Post-op Pain Management:    Induction: Intravenous  PONV Risk Score and Plan: 3 and Ondansetron, Dexamethasone, Midazolam and Scopolamine patch - Pre-op  Airway Management Planned: Oral ETT  Additional Equipment:   Intra-op Plan:   Post-operative Plan: Extubation in OR  Informed Consent: I have reviewed the patients History and Physical, chart, labs and discussed the procedure including the risks, benefits and alternatives  for the proposed anesthesia with the patient or authorized representative who has indicated his/her understanding and acceptance.   Dental advisory given  Plan Discussed with: CRNA and Anesthesiologist  Anesthesia Plan Comments:         Anesthesia Quick Evaluation

## 2018-07-11 NOTE — Transfer of Care (Signed)
Immediate Anesthesia Transfer of Care Note  Patient: Adrienne Alvarado  Procedure(s) Performed: HERNIA REPAIR UMBILICAL ADULT WITH MESH (N/A Abdomen)  Patient Location: PACU  Anesthesia Type:General  Level of Consciousness: awake and alert   Airway & Oxygen Therapy: Patient Spontanous Breathing and Patient connected to face mask oxygen  Post-op Assessment: Report given to RN and Post -op Vital signs reviewed and stable  Post vital signs: Reviewed and stable  Last Vitals:  Vitals Value Taken Time  BP 119/74 07/11/2018 10:40 AM  Temp 36.5 C 07/11/2018 10:39 AM  Pulse 68 07/11/2018 10:41 AM  Resp 13 07/11/2018 10:41 AM  SpO2 100 % 07/11/2018 10:41 AM  Vitals shown include unvalidated device data.  Last Pain:  Vitals:   07/11/18 1039  TempSrc: Temporal  PainSc:          Complications: No apparent anesthesia complications

## 2018-07-21 ENCOUNTER — Encounter: Payer: Self-pay | Admitting: Surgery

## 2018-07-21 ENCOUNTER — Other Ambulatory Visit: Payer: Self-pay

## 2018-07-21 ENCOUNTER — Ambulatory Visit (INDEPENDENT_AMBULATORY_CARE_PROVIDER_SITE_OTHER): Payer: 59 | Admitting: Surgery

## 2018-07-21 VITALS — BP 118/73 | HR 63 | Temp 98.6°F | Resp 16 | Ht 65.0 in | Wt 200.0 lb

## 2018-07-21 DIAGNOSIS — Z4889 Encounter for other specified surgical aftercare: Secondary | ICD-10-CM

## 2018-07-21 NOTE — Patient Instructions (Addendum)

## 2018-07-21 NOTE — Progress Notes (Signed)
Surgical Clinic Progress/Follow-up Note   HPI:  27 y.o. Female presents to clinic for post-op follow-up 10 Days s/p open repair of increasingly painful umbilical hernia with mesh Earlene Plater(Davis, 07/11/2018). Patient reports complete resolution of pre-operative pain and has been tolerating regular diet with +flatus and normal BM's, denies N/V, fever/chills, CP, or SOB.  Review of Systems:  Constitutional: denies fever/chills  Respiratory: denies shortness of breath, wheezing  Cardiovascular: denies chest pain, palpitations  Gastrointestinal: abdominal pain, N/V, and bowel function as per interval history Skin: Denies any other rashes or skin discolorations except post-surgical wounds as per interval history  Vital Signs:  BP 118/73   Pulse 63   Temp 98.6 F (37 C) (Temporal)   Resp 16   Ht 5\' 5"  (1.651 m)   Wt 200 lb (90.7 kg)   SpO2 98%   BMI 33.28 kg/m    Physical Exam:  Constitutional:  -- Obese body habitus (also postpartum) -- Awake, alert, and oriented x3  Pulmonary:  -- No crackles -- Equal breath sounds bilaterally -- Breathing non-labored at rest Cardiovascular:  -- S1, S2 present  -- No pericardial rubs  Gastrointestinal:  -- Soft and non-distended, non-tender to palpation, no guarding/rebound tenderness -- Post-surgical incisions all well-approximated without any peri-incisional erythema or drainage -- No abdominal masses appreciated, pulsatile or otherwise  Musculoskeletal / Integumentary:  -- Wounds or skin discoloration: None appreciated except post-surgical incisions as described above (GI) -- Extremities: B/L UE and LE FROM, hands and feet warm   Imaging: No new pertinent imaging available for review  Assessment:  27 y.o. yo Female with a problem list including...  Patient Active Problem List   Diagnosis Date Noted  . Umbilical hernia without obstruction or gangrene 07/05/2018    presents to clinic for post-op follow-up evaluation, doing very well 10 Days  s/p open repair of increasingly painful umbilical hernia with mesh Earlene Plater(Davis, 07/11/2018).  Plan:              - advance diet as tolerated              - okay to in 4 days submerge incisions under water (baths, swimming) prn             - no heavy lifting >40 lbs x 4 more weeks, after which may resume activities without restriction             - apply sunblock particularly to incisions with sun exposure to reduce pigmentation of scars             - return to clinic as needed, instructed to call office if any questions or concerns  All of the above recommendations were discussed with the patient, and all of patient's questions were answered to her expressed satisfaction.  -- Scherrie GerlachJason E. Earlene Plateravis, MD, RPVI [place]: [facility] General Surgery - Partnering for exceptional care. Office: [phone]

## 2018-08-03 ENCOUNTER — Ambulatory Visit (INDEPENDENT_AMBULATORY_CARE_PROVIDER_SITE_OTHER): Payer: 59 | Admitting: Certified Nurse Midwife

## 2018-08-03 NOTE — Patient Instructions (Signed)
Preventive Care 18-39 Years, Female Preventive care refers to lifestyle choices and visits with your health care provider that can promote health and wellness. What does preventive care include?  A yearly physical exam. This is also called an annual well check.  Dental exams once or twice a year.  Routine eye exams. Ask your health care provider how often you should have your eyes checked.  Personal lifestyle choices, including: ? Daily care of your teeth and gums. ? Regular physical activity. ? Eating a healthy diet. ? Avoiding tobacco and drug use. ? Limiting alcohol use. ? Practicing safe sex. ? Taking vitamin and mineral supplements as recommended by your health care provider. What happens during an annual well check? The services and screenings done by your health care provider during your annual well check will depend on your age, overall health, lifestyle risk factors, and family history of disease. Counseling Your health care provider may ask you questions about your:  Alcohol use.  Tobacco use.  Drug use.  Emotional well-being.  Home and relationship well-being.  Sexual activity.  Eating habits.  Work and work Statistician.  Method of birth control.  Menstrual cycle.  Pregnancy history.  Screening You may have the following tests or measurements:  Height, weight, and BMI.  Diabetes screening. This is done by checking your blood sugar (glucose) after you have not eaten for a while (fasting).  Blood pressure.  Lipid and cholesterol levels. These may be checked every 5 years starting at age 66.  Skin check.  Hepatitis C blood test.  Hepatitis B blood test.  Sexually transmitted disease (STD) testing.  BRCA-related cancer screening. This may be done if you have a family history of breast, ovarian, tubal, or peritoneal cancers.  Pelvic exam and Pap test. This may be done every 3 years starting at age 40. Starting at age 59, this may be done every 5  years if you have a Pap test in combination with an HPV test.  Discuss your test results, treatment options, and if necessary, the need for more tests with your health care provider. Vaccines Your health care provider may recommend certain vaccines, such as:  Influenza vaccine. This is recommended every year.  Tetanus, diphtheria, and acellular pertussis (Tdap, Td) vaccine. You may need a Td booster every 10 years.  Varicella vaccine. You may need this if you have not been vaccinated.  HPV vaccine. If you are 69 or younger, you may need three doses over 6 months.  Measles, mumps, and rubella (MMR) vaccine. You may need at least one dose of MMR. You may also need a second dose.  Pneumococcal 13-valent conjugate (PCV13) vaccine. You may need this if you have certain conditions and were not previously vaccinated.  Pneumococcal polysaccharide (PPSV23) vaccine. You may need one or two doses if you smoke cigarettes or if you have certain conditions.  Meningococcal vaccine. One dose is recommended if you are age 27-21 years and a first-year college student living in a residence hall, or if you have one of several medical conditions. You may also need additional booster doses.  Hepatitis A vaccine. You may need this if you have certain conditions or if you travel or work in places where you may be exposed to hepatitis A.  Hepatitis B vaccine. You may need this if you have certain conditions or if you travel or work in places where you may be exposed to hepatitis B.  Haemophilus influenzae type b (Hib) vaccine. You may need this if  you have certain risk factors.  Talk to your health care provider about which screenings and vaccines you need and how often you need them. This information is not intended to replace advice given to you by your health care provider. Make sure you discuss any questions you have with your health care provider. Document Released: 10/13/2001 Document Revised: 05/06/2016  Document Reviewed: 06/18/2015 Elsevier Interactive Patient Education  [employer].

## 2018-08-03 NOTE — Progress Notes (Signed)
Subjective:    Paulo FruitDanielle Barella is a 27 y.o. G1P0 Caucasian female who presents for a postpartum visit. She is 6 weeks postpartum following a spontaneous vaginal delivery at 40.3 gestational weeks. Anesthesia: epidural. I have fully reviewed the prenatal and intrapartum course. Postpartum course has been complicated by hernia that was repaired. Baby's course has been WNL. Baby is feeding by breast. Bleeding no bleeding. Bowel function is normal. Bladder function is normal. Patient is not sexually active. Last sexual activity: prior to delivery. Contraception method is none. Will get IUD placed. Going out of town until Jan. Sample of POP given. Postpartum depression screening: negative. Score 4.  Last pap 4/2019and was negative.  The following portions of the patient's history were reviewed and updated as appropriate: allergies, current medications, past medical history, past surgical history and problem list.  Review of Systems Pertinent items are noted in HPI.   Vitals:   08/03/18 0934  BP: 106/80  Pulse: 89  Weight: 203 lb (92.1 kg)  Height: 5\' 5"  (1.651 m)   No LMP recorded.  Objective:   General:  alert, cooperative and no distress   Breasts:  deferred, no complaints  Lungs: clear to auscultation bilaterally  Heart:  regular rate and rhythm  Abdomen: soft, nontender   Vulva: normal  Vagina: normal vagina  Cervix:  closed  Corpus: Well-involuted  Adnexa:  Non-palpable  Rectal Exam: No hemorrhoids        Assessment:   Postpartum exam 6 wks s/p NSVD Breast feeding Depression screening Contraception counseling   Plan:  : oral progesterone-only contraceptive x 1 month then Mirena IUD  Follow up in: 1 month for IUD or earlier if needed  Doreene BurkeAnnie Kacee Koren, CNM

## 2018-08-16 DIAGNOSIS — S0502XA Injury of conjunctiva and corneal abrasion without foreign body, left eye, initial encounter: Secondary | ICD-10-CM | POA: Diagnosis not present

## 2018-08-18 DIAGNOSIS — S0502XA Injury of conjunctiva and corneal abrasion without foreign body, left eye, initial encounter: Secondary | ICD-10-CM | POA: Diagnosis not present

## 2018-08-29 ENCOUNTER — Encounter: Payer: Self-pay | Admitting: Certified Nurse Midwife

## 2018-08-29 ENCOUNTER — Ambulatory Visit (INDEPENDENT_AMBULATORY_CARE_PROVIDER_SITE_OTHER): Payer: 59 | Admitting: Certified Nurse Midwife

## 2018-08-29 VITALS — BP 103/61 | HR 63 | Ht 65.0 in | Wt 209.1 lb

## 2018-08-29 DIAGNOSIS — Z30430 Encounter for insertion of intrauterine contraceptive device: Secondary | ICD-10-CM | POA: Diagnosis not present

## 2018-08-29 DIAGNOSIS — F419 Anxiety disorder, unspecified: Secondary | ICD-10-CM

## 2018-08-29 DIAGNOSIS — Z3201 Encounter for pregnancy test, result positive: Secondary | ICD-10-CM | POA: Diagnosis not present

## 2018-08-29 LAB — POCT URINE PREGNANCY: Preg Test, Ur: NEGATIVE

## 2018-08-29 MED ORDER — SERTRALINE HCL 25 MG PO TABS: 25.0000 mg | ORAL_TABLET | Freq: Every day | ORAL | 1 refills | Status: DC

## 2018-08-29 NOTE — Progress Notes (Signed)
  GYNECOLOGY OFFICE PROCEDURE NOTE  Adrienne Alvarado is a 27 y.o. G1P0 here for Mirena IUD insertion. No GYN concerns.  Last pap smear was on 12/06/2017 and was normal.  IUD Insertion Procedure Note Patient identified, informed consent performed, consent signed.   Discussed risks of irregular bleeding, cramping, infection, malpositioning or misplacement of the IUD outside the uterus which may require further procedure such as laparoscopy. Time out was performed.  Urine pregnancy test negative.  Speculum placed in the vagina.  Cervix visualized.  Cleaned with Betadine x 2.  Grasped anteriorly with a single tooth tenaculum.  Uterus sounded to 8 cm.  mirena IUD placed per manufacturer's recommendations.  Strings trimmed to 3 cm. Tenaculum was removed, good hemostasis noted.  Patient tolerated procedure well.   After placement pt mentioned that she would like to see a counselor for anxiety. She states she is having so much anxiety that she is not able to sleep at night. She states that her spouse some times questions the way that she does things ( as apposed to this way")  Which causes more anixiety. She would like to speak with someone one that is non biased ( not family or friends). She is also interested in starting medication. Discussed medication options.  Will start on zoloft 25 mg daily. Phq9: 5  GAD: 20  Patient was given post-procedure instructions.  She was advised to have backup contraception for one week.  Patient was also asked to check IUD strings periodically and follow up in 4 weeks for IUD check.   Adrienne Alvarado, CNM

## 2018-08-29 NOTE — Patient Instructions (Signed)
Intrauterine Device Insertion, Care After    This sheet gives you information about how to care for yourself after your procedure. Your health care provider may also give you more specific instructions. If you have problems or questions, contact your health care provider.  What can I expect after the procedure?  After the procedure, it is common to have:  · Cramps and pain in the abdomen.  · Light bleeding (spotting) or heavier bleeding that is like your menstrual period. This may last for up to a few days.  · Lower back pain.  · Dizziness.  · Headaches.  · Nausea.  Follow these instructions at home:  · Before resuming sexual activity, check to make sure that you can feel the IUD string(s). You should be able to feel the end of the string(s) below the opening of your cervix. If your IUD string is in place, you may resume sexual activity.  ? If you had a hormonal IUD inserted more than 7 days after your most recent period started, you will need to use a backup method of birth control for 7 days after IUD insertion. Ask your health care provider whether this applies to you.  · Continue to check that the IUD is still in place by feeling for the string(s) after every menstrual period, or once a month.  · Take over-the-counter and prescription medicines only as told by your health care provider.  · Do not drive or use heavy machinery while taking prescription pain medicine.  · Keep all follow-up visits as told by your health care provider. This is important.  Contact a health care provider if:  · You have bleeding that is heavier or lasts longer than a normal menstrual cycle.  · You have a fever.  · You have cramps or abdominal pain that get worse or do not get better with medicine.  · You develop abdominal pain that is new or is not in the same area of earlier cramping and pain.  · You feel lightheaded or weak.  · You have abnormal or bad-smelling discharge from your vagina.  · You have pain during sexual  activity.  · You have any of the following problems with your IUD string(s):  ? The string bothers or hurts you or your sexual partner.  ? You cannot feel the string.  ? The string has gotten longer.  · You can feel the IUD in your vagina.  · You think you may be pregnant, or you miss your menstrual period.  · You think you may have an STI (sexually transmitted infection).  Get help right away if:  · You have flu-like symptoms.  · You have a fever and chills.  · You can feel that your IUD has slipped out of place.  Summary  · After the procedure, it is common to have cramps and pain in the abdomen. It is also common to have light bleeding (spotting) or heavier bleeding that is like your menstrual period.  · Continue to check that the IUD is still in place by feeling for the string(s) after every menstrual period, or once a month.  · Keep all follow-up visits as told by your health care provider. This is important.  · Contact your health care provider if you have problems with your IUD string(s), such as the string getting longer or bothering you or your sexual partner.  This information is not intended to replace advice given to you by your health care provider. Make   sure you discuss any questions you have with your health care provider.  Document Released: 04/15/2011 Document Revised: 07/08/2016 Document Reviewed: 07/08/2016  Elsevier Interactive Patient Education © 2019 Elsevier Inc.

## 2018-09-21 ENCOUNTER — Other Ambulatory Visit: Payer: Self-pay | Admitting: Certified Nurse Midwife

## 2018-09-26 ENCOUNTER — Encounter: Payer: Self-pay | Admitting: Certified Nurse Midwife

## 2018-09-26 ENCOUNTER — Ambulatory Visit (INDEPENDENT_AMBULATORY_CARE_PROVIDER_SITE_OTHER): Payer: 59 | Admitting: Certified Nurse Midwife

## 2018-09-26 VITALS — BP 121/81 | HR 72 | Ht 65.0 in | Wt 211.6 lb

## 2018-09-26 DIAGNOSIS — F419 Anxiety disorder, unspecified: Secondary | ICD-10-CM | POA: Diagnosis not present

## 2018-09-26 DIAGNOSIS — Z30431 Encounter for routine checking of intrauterine contraceptive device: Secondary | ICD-10-CM

## 2018-09-26 MED ORDER — SERTRALINE HCL 25 MG PO TABS: 25.0000 mg | ORAL_TABLET | Freq: Every day | ORAL | 11 refills | Status: DC

## 2018-09-26 NOTE — Patient Instructions (Signed)

## 2018-09-26 NOTE — Progress Notes (Signed)
   GYNECOLOGY OFFICE ENCOUNTER NOTE  History:  28 y.o. G1P0 here today for today for IUD string check; Mirena  IUD was placed  08/29/18  No complaints about the IUD, no concerning side effects.  The following portions of the patient's history were reviewed and updated as appropriate: allergies, current medications, past family history, past medical history, past social history, past surgical history and problem list. Last pap smear on 12/06/17 was normal, negative.  Review of Systems:  Pertinent items are noted in HPI.   Objective:  Physical Exam currently breastfeeding. CONSTITUTIONAL: Well-developed, well-nourished female in no acute distress.  HENT:  Normocephalic, atraumatic. External right and left ear normal. Oropharynx is clear and moist EYES: Conjunctivae and EOM are normal. Pupils are equal, round, and reactive to light. No scleral icterus.  NECK: Normal range of motion, supple, no masses CARDIOVASCULAR: Normal heart rate noted RESPIRATORY: Effort and breath sounds normal, no problems with respiration noted ABDOMEN: Soft, no distention noted.   PELVIC: Normal appearing external genitalia; normal appearing vaginal mucosa and cervix.  IUD strings visualized, about 3 cm in length outside cervix.     Office Visit from 09/26/2018 in [facility]  PHQ-9 Total Score  4     GAD 7 : Generalized Anxiety Score 09/26/2018 08/29/2018  Nervous, Anxious, on Edge 2 3  Control/stop worrying 1 3  Worry too much - different things 1 3  Trouble relaxing 2 3  Restless 2 2  Easily annoyed or irritable 1 3  Afraid - awful might happen 0 3  Total GAD 7 Score 9 20  Anxiety Difficulty Not difficult at all Somewhat difficult      Assessment & Plan:  Patient to keep IUD in place for up to five years; can come in for removal if she desires pregnancy earlier or for or concerning side effects. She is doing well with zoloft 25mg  po daily. States she is less stressed. She would like to  continue. Refill placed . Follow up as needed or for annual exam.  Doreene Burke, CNM    Doreene Burke, CNM

## 2018-11-22 ENCOUNTER — Other Ambulatory Visit: Payer: Self-pay | Admitting: Certified Nurse Midwife

## 2019-03-17 ENCOUNTER — Other Ambulatory Visit: Payer: Self-pay

## 2019-03-17 ENCOUNTER — Emergency Department: Payer: 59

## 2019-03-17 ENCOUNTER — Encounter: Payer: Self-pay | Admitting: Emergency Medicine

## 2019-03-17 ENCOUNTER — Emergency Department
Admission: EM | Admit: 2019-03-17 | Discharge: 2019-03-17 | Disposition: A | Discharge status: Home / Self Care | Payer: 59 | Attending: Emergency Medicine | Admitting: Emergency Medicine

## 2019-03-17 DIAGNOSIS — Z79899 Other long term (current) drug therapy: Secondary | ICD-10-CM | POA: Insufficient documentation

## 2019-03-17 DIAGNOSIS — Z87891 Personal history of nicotine dependence: Secondary | ICD-10-CM | POA: Insufficient documentation

## 2019-03-17 DIAGNOSIS — R519 Headache, unspecified: Secondary | ICD-10-CM

## 2019-03-17 DIAGNOSIS — E039 Hypothyroidism, unspecified: Secondary | ICD-10-CM | POA: Insufficient documentation

## 2019-03-17 DIAGNOSIS — R51 Headache: Secondary | ICD-10-CM | POA: Diagnosis not present

## 2019-03-17 MED ORDER — OXCARBAZEPINE 300 MG PO TABS: 300.0000 mg | ORAL_TABLET | Freq: Two times a day (BID) | ORAL | 1 refills | Status: DC

## 2019-03-17 MED ORDER — PROCHLORPERAZINE EDISYLATE 10 MG/2ML IJ SOLN
10.0000 mg | Freq: Once | INTRAMUSCULAR | Status: AC
  Administered 2019-03-17: 21:00:00 10 mg via INTRAVENOUS
  Filled 2019-03-17: qty 2

## 2019-03-17 MED ORDER — SODIUM CHLORIDE 0.9 % IV BOLUS
1000.0000 mL | Freq: Once | INTRAVENOUS | Status: AC
  Administered 2019-03-17: 21:00:00 1000 mL via INTRAVENOUS

## 2019-03-17 MED ORDER — OXCARBAZEPINE 300 MG PO TABS
300.0000 mg | ORAL_TABLET | Freq: Two times a day (BID) | ORAL | Status: DC
  Filled 2019-03-17: qty 1

## 2019-03-17 MED ORDER — DEXAMETHASONE SODIUM PHOSPHATE 10 MG/ML IJ SOLN
10.0000 mg | Freq: Once | INTRAMUSCULAR | Status: AC
  Administered 2019-03-17: 10 mg via INTRAVENOUS
  Filled 2019-03-17: qty 1

## 2019-03-17 NOTE — ED Triage Notes (Signed)
Pt arrives with complaints of sharp pain to the back of pt's left head. Pt reports pain started Monday. PT reports associated nausea and vomiting. Pt reports vision changes with pain that lasted for one hour today. Pt denies any current vision issues but reports the sharp pain is still present in the back of her head. No neuro deficits in triage.

## 2019-03-17 NOTE — ED Provider Notes (Addendum)
[facility] Emergency Department Provider Note   ____________________________________________   First MD Initiated Contact with Patient 03/17/19 1543     (approximate)  I have reviewed the triage vital signs and the nursing notes.   HISTORY  Chief Complaint Headache    HPI Adrienne Alvarado is a 28 y.o. female patient reports sharp pain left side of the back of her head on Monday.  This is waxed and waned today she had spots in front of her eyes that lasted for about an hour and then went away.  After they went away she vomited once.  She is not having any more vision problems but still having a sharp pain.  It is not reproduced by palpation.  I discussed the patient with neurology.  Initial CT was negative Dr. Thad Rangereynolds suggested an MRI and if it is negative follow-up outpatient.         Past Medical History:  Diagnosis Date   Hypothyroid    PCOS (polycystic ovarian syndrome)    PCOS (polycystic ovarian syndrome)    PONV (postoperative nausea and vomiting)    Pregnancy 02/26/2018   Umbilical hernia     Patient Active Problem List   Diagnosis Date Noted   Anxiety 09/26/2018    Past Surgical History:  Procedure Laterality Date   APPENDECTOMY     UMBILICAL HERNIA REPAIR N/A 07/11/2018   Procedure: HERNIA REPAIR UMBILICAL ADULT WITH MESH;  Surgeon: Ancil Linseyavis, Jason Evan, MD;  Location: [facility] ORS;  Service: Vascular;  Laterality: N/A;    Prior to Admission medications   Medication Sig Start Date End Date Taking? Authorizing Provider  sertraline (ZOLOFT) 25 MG tablet Take 1 tablet (25 mg total) by mouth daily. 09/26/18   Doreene Burkehompson, Annie, CNM    Allergies Amoxicillin, Muscle relief [capsaicin], Penicillins, and Methocarbamol  Family History  Problem Relation Age of Onset   Cancer Mother        breast   Diabetes Mother        type 2   Multiple sclerosis Mother    Breast cancer Mother    Ovarian cancer Neg Hx    Colon cancer Neg Hx      Social History Social History   Tobacco Use   Smoking status: Former Smoker    Packs/day: 0.25    Years: 2.00    Pack years: 0.50    Types: Cigarettes    Quit date: 02/03/2016    Years since quitting: 3.1   Smokeless tobacco: Never Used  Substance Use Topics   Alcohol use: Not Currently   Drug use: No    Review of Systems  Constitutional: No fever/chills Eyes: No visual changes. ENT: No sore throat. Cardiovascular: Denies chest pain. Respiratory: Denies shortness of breath. Gastrointestinal: No abdominal pain.  No nausea, no vomiting.  No diarrhea.  No constipation. Genitourinary: Negative for dysuria. Musculoskeletal: Negative for back pain. Skin: Negative for rash. Neurological: Negative for headaches, focal weakness   ____________________________________________   PHYSICAL EXAM:  VITAL SIGNS: ED Triage Vitals  Enc Vitals Group     BP 03/17/19 1307 132/78     Pulse Rate 03/17/19 1304 70     Resp 03/17/19 1307 18     Temp 03/17/19 1307 98.7 F (37.1 C)     Temp Source 03/17/19 1307 Oral     SpO2 03/17/19 1307 97 %     Weight 03/17/19 1308 195 lb (88.5 kg)     Height 03/17/19 1308 5\' 5"  (1.651 m)  Head Circumference --      Peak Flow --      Pain Score 03/17/19 1308 8     Pain Loc --      Pain Edu? --      Excl. in [place]? --     Constitutional: Alert and oriented. Well appearing and in no acute distress. Eyes: Conjunctivae are normal. PERRL. EOMI. fundi slightly difficult to see but look normal Head: Atraumatic. Nose: No congestion/rhinnorhea. Mouth/Throat: Mucous membranes are moist.  Oropharynx non-erythematous. Neck: No stridor.  Cardiovascular: Normal rate, regular rhythm. Grossly normal heart sounds.  Good peripheral circulation. Respiratory: Normal respiratory effort.  No retractions. Lungs CTAB. Gastrointestinal: Soft and nontender. No distention. No abdominal bruits. No CVA tenderness. Musculoskeletal: No lower extremity tenderness nor  edema.  Neurologic:  Normal speech and language. No gross focal neurologic deficits are appreciated.  Cranial nerves II through XII are intact although visual fields were not checked cerebellar finger-to-nose heel-to-shin and rapid alternating movements and hands are normal motor strength is 5/5 throughout patient does not report any numbness Skin:  Skin is warm, dry and intact. No rash noted. Psychiatric: Mood and affect are normal. Speech and behavior are normal.  ____________________________________________   LABS (all labs ordered are listed, but only abnormal results are displayed)  Labs Reviewed - No data to display ____________________________________________  EKG   ____________________________________________  RADIOLOGY  ED MD interpretation: CT of the head read by radiology reviewed by me is negative MRI also was read read by radiology as negative  Official radiology report(s): Ct Head Wo Contrast  Result Date: 03/17/2019 CLINICAL DATA:  Pt arrives with complaints of sharp pain to the back of pt's left head. Pt reports pain started Monday. PT reports associated nausea and vomiting. Pt reports vision changes with pain that lasted for one hour today. Pt denies any current vision issues but reports the sharp pain is still present in the back of her head. No neuro deficits in triage. EXAM: CT HEAD WITHOUT CONTRAST TECHNIQUE: Contiguous axial images were obtained from the base of the skull through the vertex without intravenous contrast. COMPARISON:  None. FINDINGS: Brain: No evidence of acute infarction, hemorrhage, hydrocephalus, extra-axial collection or mass lesion/mass effect. Vascular: No hyperdense vessel or unexpected calcification. Skull: Normal. Negative for fracture or focal lesion. Sinuses/Orbits: Normal globes and orbits. Visualized sinuses and mastoid air cells clear. Other: None. IMPRESSION: Normal unenhanced CT scan of the brain. Electronically Signed   By: Lajean Manes  M.D.   On: 03/17/2019 15:42   Mr Brain Wo Contrast  Result Date: 03/17/2019 CLINICAL DATA:  Posterior head pain with nausea, vomiting, and visual changes. EXAM: MRI HEAD WITHOUT CONTRAST TECHNIQUE: Multiplanar, multiecho pulse sequences of the brain and surrounding structures were obtained without intravenous contrast. COMPARISON:  Head CT 03/17/2019 FINDINGS: Brain: There is no evidence of acute infarct, intracranial hemorrhage, mass, midline shift, or extra-axial fluid collection. The ventricles and sulci are normal. A single punctate focus of T2 hyperintensity at the anterior aspect of the right external capsule is nonspecific and likely of no clinical significance. The pituitary gland is normal in size. The cerebellar tonsils are normally positioned. Vascular: Major intracranial vascular flow voids are preserved. Skull and upper cervical spine: Unremarkable bone marrow signal. Sinuses/Orbits: Unremarkable orbits. No evidence of significant inflammatory changes in the paranasal sinuses or mastoid air cells. Other: None. IMPRESSION: Negative brain MRI. Electronically Signed   By: Logan Bores M.D.   On: 03/17/2019 20:15    ____________________________________________  PROCEDURES  Procedure(s) performed (including Critical Care):  Procedures   ____________________________________________   INITIAL IMPRESSION / ASSESSMENT AND PLAN / ED COURSE As discussed with Dr. Thad Rangereynolds I will have the patient follow-up with outpatient neurology.  I will give her some Compazine fluids and dexamethasone in case this is any sort of migraine type headache although the temporal relationship of the spots in front of her eyes and the headache does not make it sound like that.    Adrienne Alvarado was evaluated in Emergency Department on 03/17/2019 for the symptoms described in the history of present illness. She was evaluated in the context of the global COVID-19 pandemic, which necessitated consideration that the  patient might be at risk for infection with the SARS-CoV-2 virus that causes COVID-19. Institutional protocols and algorithms that pertain to the evaluation of patients at risk for COVID-19 are in a [place] of rapid change based on information released by regulatory bodies including the CDC and federal and [employer] organizations. These policies and algorithms were followed during the patient's care in the ED.    Discussed with patient if headache resolves here in the ER with the fluids and Compazine and dexamethasone I will not have her take the oxcarbazepine.  If it does not resolve I will have her take the oxcarbazepine as instructed.  I went over this with her very carefully.  She understands and will comply with my instructions.     ____________________________________________   FINAL CLINICAL IMPRESSION(S) / ED DIAGNOSES  Final diagnoses:  Acute nonintractable headache, unspecified headache type     ED Discharge Orders    None       Note:  This document was prepared using Dragon voice recognition software and may include unintentional dictation errors.    Arnaldo NatalMalinda, Roth Ress F, MD 03/17/19 2029    Arnaldo NatalMalinda, Harmonee Tozer F, MD 03/17/19 2131

## 2019-03-17 NOTE — ED Notes (Signed)
Patient transported to CT 

## 2019-03-17 NOTE — Discharge Instructions (Addendum)
Please call either Dr. Manuella Ghazi or Dr. Melrose Nakayama the neurologist in town.  Let them know you were seen in the emergency room for his headache.  They should be out of see you fairly rapidly.  Please return here if the headache gets worse or if you develop any other symptoms like numbness or weakness or if you get a fever or vomiting. Because the pain is coming and going on occasionally feels very sharp and shooting we will try some Trileptal medication and take 1 twice a day for 3 days and if it is not helping you can increase it to 1 in the morning and 2 in the evening for 3 days and if that does not help you can increase up to 1 twice a day for 3 days if that does not help and you have not seen the neurologist by that time you can return here for recheck.

## 2019-03-17 NOTE — ED Notes (Signed)
Pt spoke with MRI for screening. NAD. Unlabored.

## 2019-03-17 NOTE — ED Notes (Signed)
Waiting to go for MRI. Given blanket. NAD

## 2019-10-13 IMAGING — CT CT HEAD WITHOUT CONTRAST
3 series · 15 of 47 positions shown, 18 images · non-contrast
Comparison: None.

CLINICAL DATA: Pt arrives with complaints of sharp pain to the back
of pt's left head. Pt reports pain started [REDACTED]. PT reports
associated nausea and vomiting. Pt reports vision changes with pain
that lasted for one hour today. Pt denies any current vision issues
but reports the sharp pain is still present in the back of her head.
No neuro deficits in triage.

EXAM:
CT HEAD WITHOUT CONTRAST
TECHNIQUE: Contiguous axial images were obtained from the base of the skull
through the vertex without intravenous contrast.

[Series 3: head wo · axial · 0.40mm/px · z∈[-125,-0]mm · 9 of 30 slices shown, 12 images]
[im 3/30  brain]
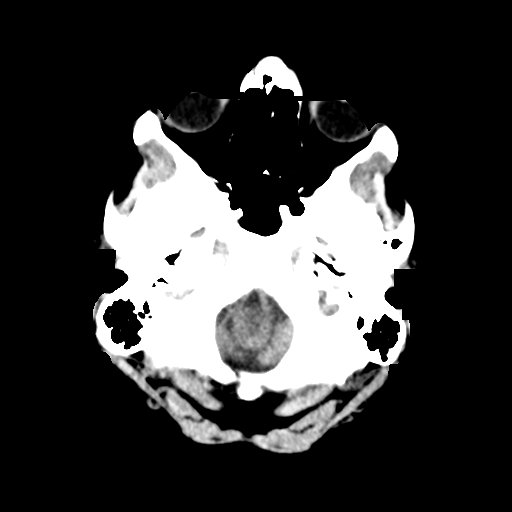
[im 3/30  bone]
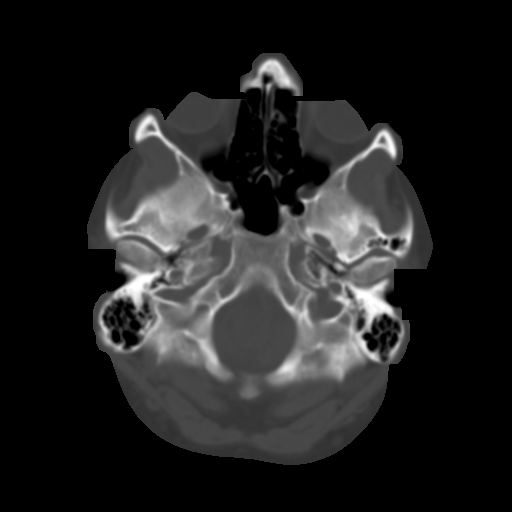
[im 6/30  brain]
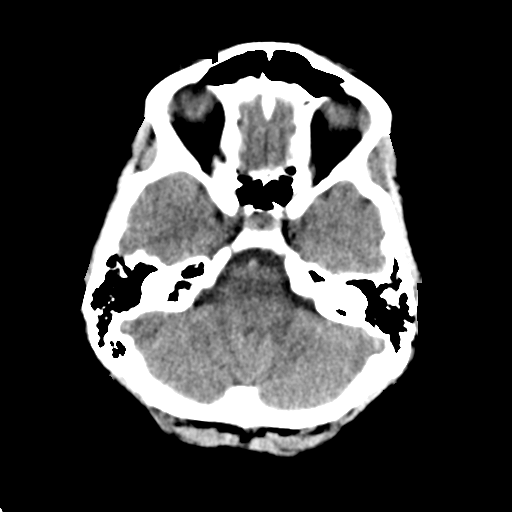
[im 9/30  brain]
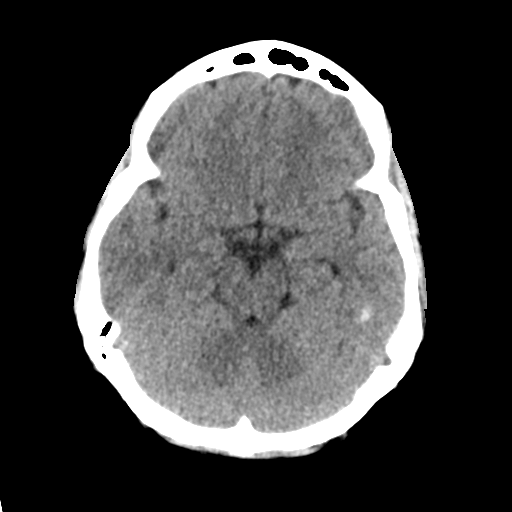
[im 12/30  brain]
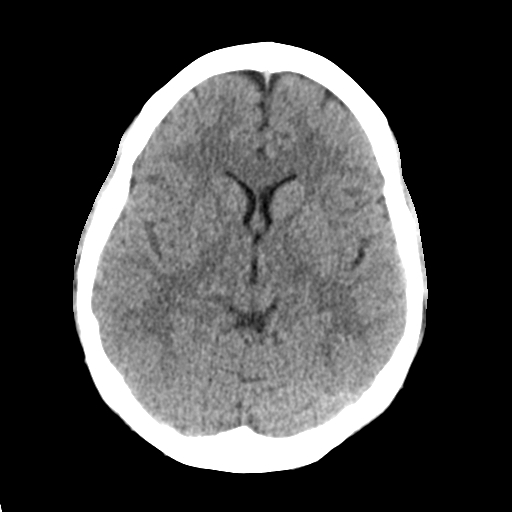
[im 16/30  brain]
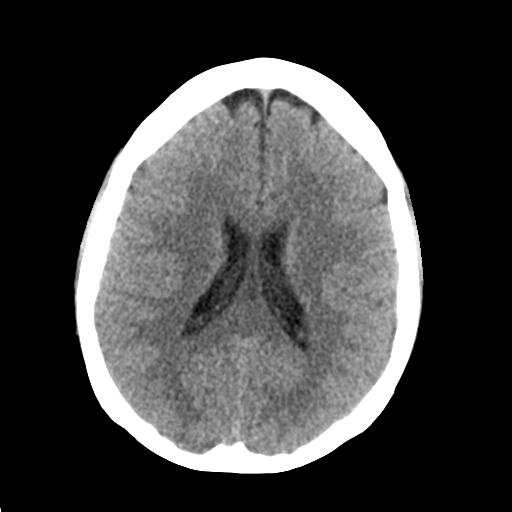
[im 16/30  bone]
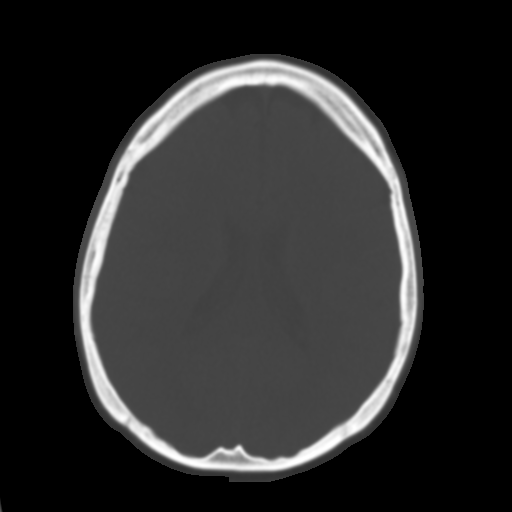
[im 19/30  brain]
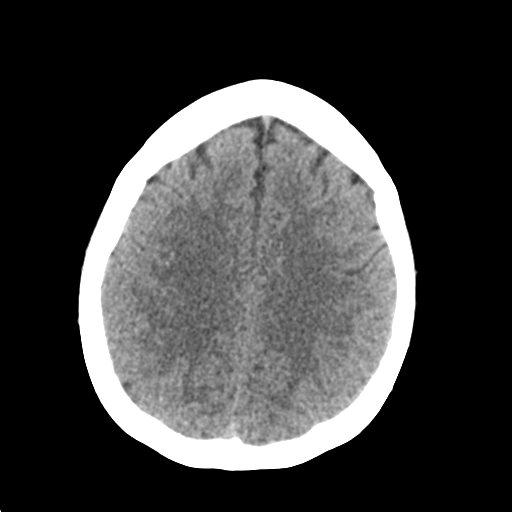
[im 22/30  brain]
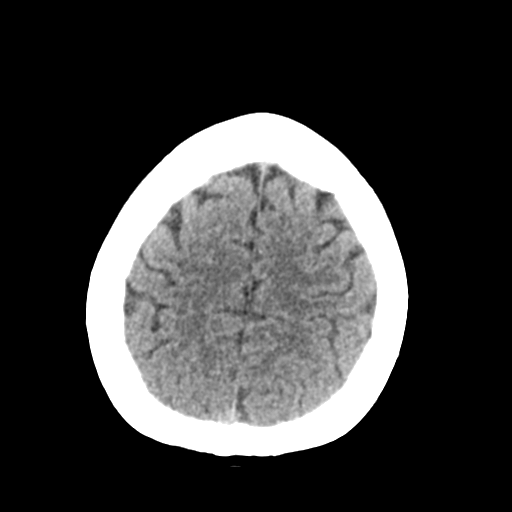
[im 25/30  brain]
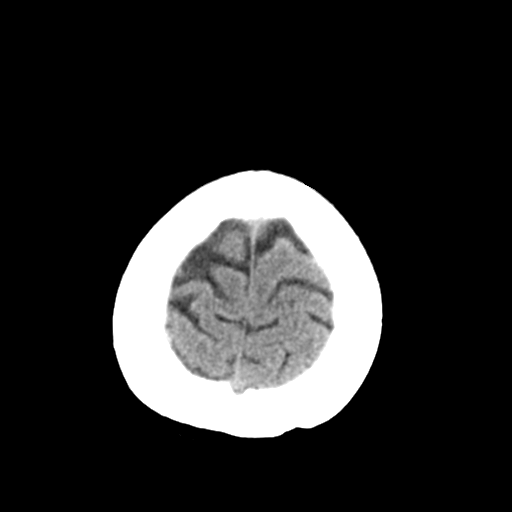
[im 28/30  brain]
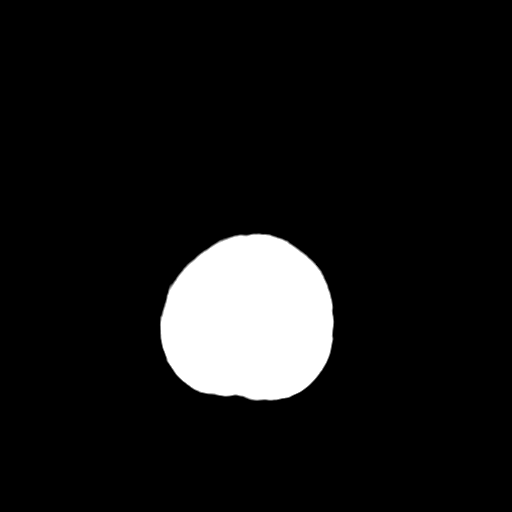
[im 28/30  bone]
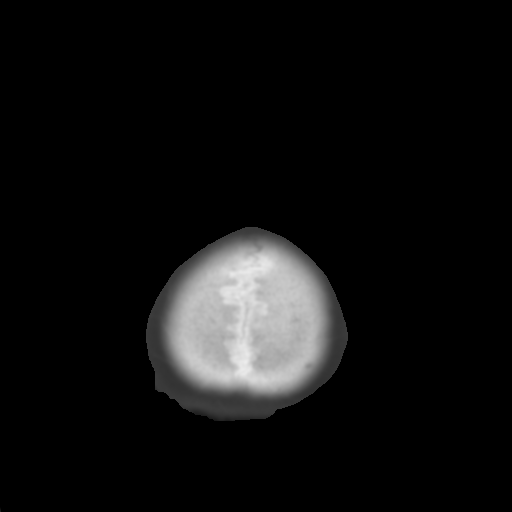

[Series 4: coronal soft tissue · coronal · 0.33mm/px · 3 of 64 slices shown]
[im 22/64  brain]
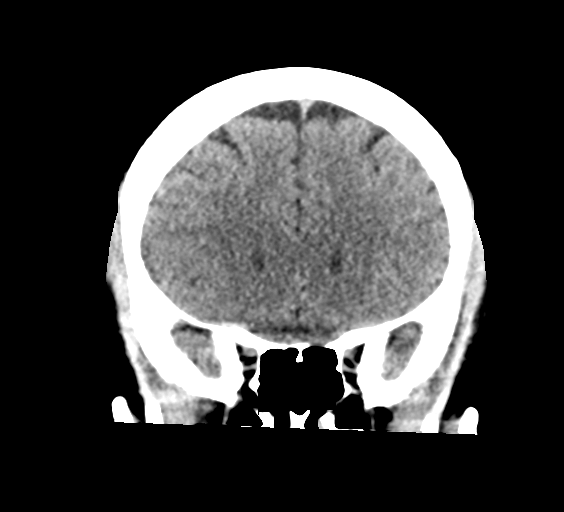
[im 29/64  brain]
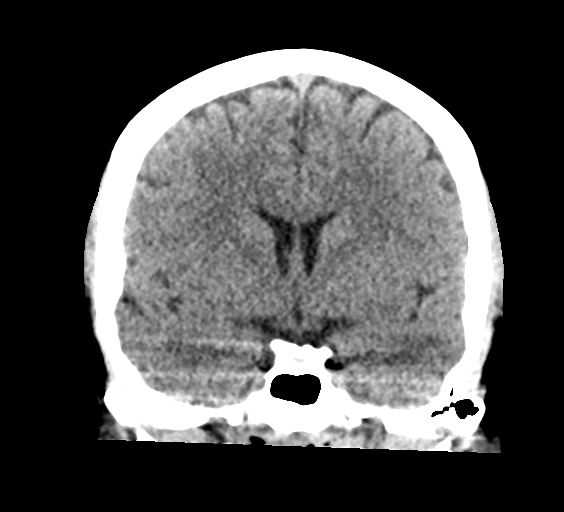
[im 36/64  brain]
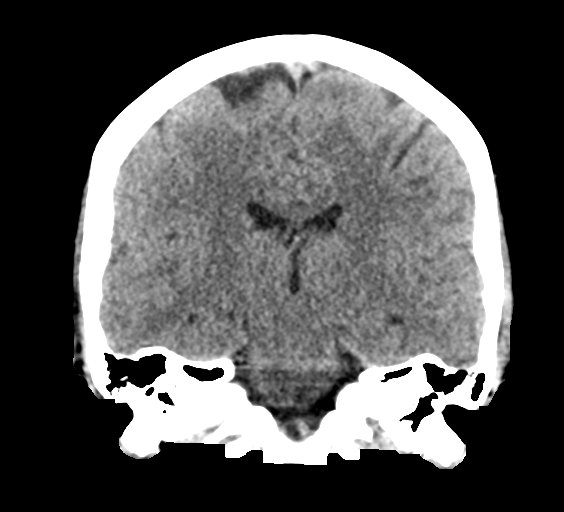

[Series 5: sagittal soft tissue · sagittal · 0.34mm/px · 3 of 53 slices shown]
[im 18/53  brain]
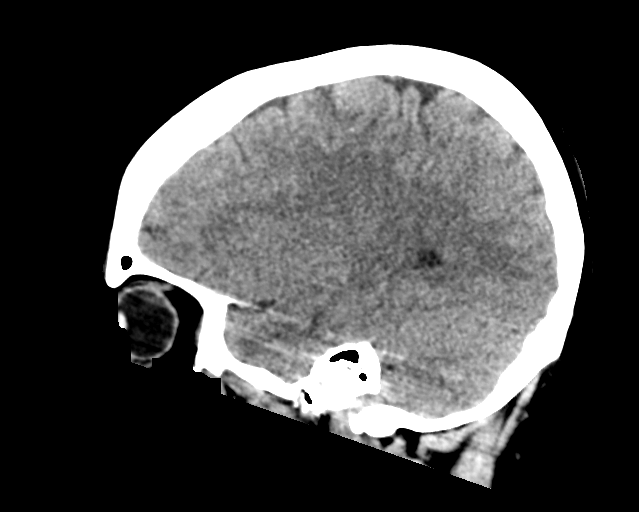
[im 27/53  brain]
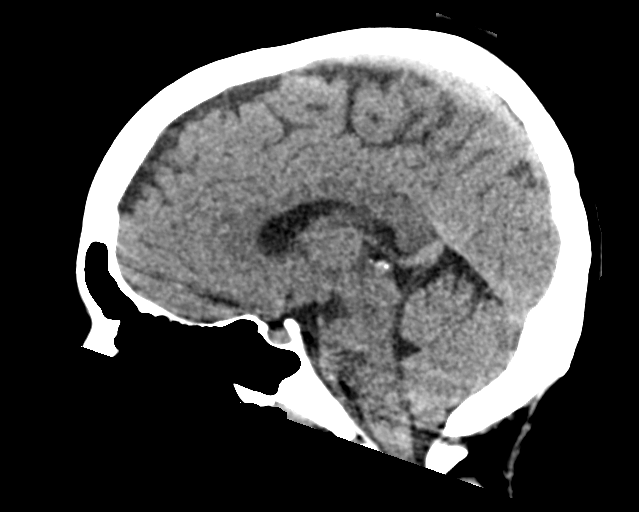
[im 35/53  brain]
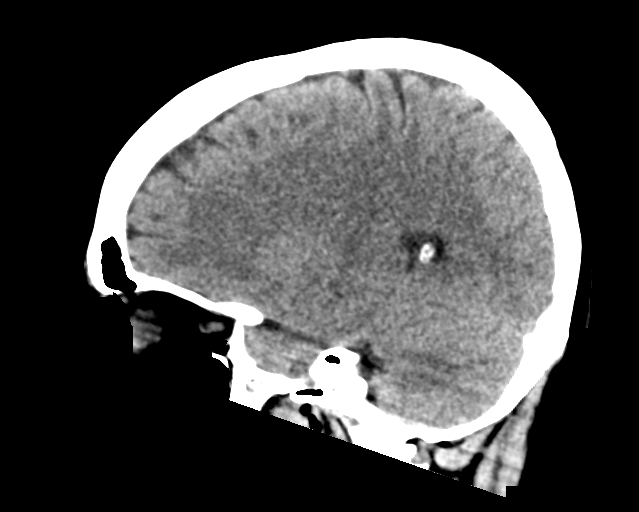

[15 of 47 positions shown; findings below may reference images not displayed]

FINDINGS: Brain: No evidence of acute infarction, hemorrhage, hydrocephalus,
extra-axial collection or mass lesion/mass effect.

Vascular: No hyperdense vessel or unexpected calcification.

Skull: Normal. Negative for fracture or focal lesion.

Sinuses/Orbits: Normal globes and orbits. Visualized sinuses and
mastoid air cells clear.

Other: None.
IMPRESSION: Normal unenhanced CT scan of the brain.

## 2020-03-17 ENCOUNTER — Emergency Department
Admission: EM | Admit: 2020-03-17 | Discharge: 2020-03-17 | Disposition: A | Discharge status: Home / Self Care | Payer: 59 | Attending: Emergency Medicine | Admitting: Emergency Medicine

## 2020-03-17 ENCOUNTER — Encounter: Payer: Self-pay | Admitting: Emergency Medicine

## 2020-03-17 ENCOUNTER — Other Ambulatory Visit: Payer: Self-pay

## 2020-03-17 DIAGNOSIS — Z5321 Procedure and treatment not carried out due to patient leaving prior to being seen by health care provider: Secondary | ICD-10-CM | POA: Diagnosis not present

## 2020-03-17 DIAGNOSIS — R109 Unspecified abdominal pain: Secondary | ICD-10-CM | POA: Insufficient documentation

## 2020-03-17 LAB — URINALYSIS, COMPLETE (UACMP) WITH MICROSCOPIC
Bacteria, UA: NONE SEEN
Bilirubin Urine: NEGATIVE
Glucose, UA: NEGATIVE mg/dL
Hgb urine dipstick: NEGATIVE
Ketones, ur: NEGATIVE mg/dL
Leukocytes,Ua: NEGATIVE
Nitrite: NEGATIVE
Protein, ur: NEGATIVE mg/dL
Specific Gravity, Urine: 1.017 (ref 1.005–1.030)
pH: 6 (ref 5.0–8.0)

## 2020-03-17 LAB — CBC
HCT: 36 % (ref 36.0–46.0)
Hemoglobin: 12.5 g/dL (ref 12.0–15.0)
MCH: 30.3 pg (ref 26.0–34.0)
MCHC: 34.7 g/dL (ref 30.0–36.0)
MCV: 87.2 fL (ref 80.0–100.0)
Platelets: 308 10*3/uL (ref 150–400)
RBC: 4.13 MIL/uL (ref 3.87–5.11)
RDW: 13.3 % (ref 11.5–15.5)
WBC: 8.1 10*3/uL (ref 4.0–10.5)
nRBC: 0 % (ref 0.0–0.2)

## 2020-03-17 LAB — COMPREHENSIVE METABOLIC PANEL
ALT: 18 U/L (ref 0–44)
AST: 22 U/L (ref 15–41)
Albumin: 4.1 g/dL (ref 3.5–5.0)
Alkaline Phosphatase: 33 U/L — ABNORMAL LOW (ref 38–126)
Anion gap: 7 (ref 5–15)
BUN: 12 mg/dL (ref 6–20)
CO2: 19 mmol/L — ABNORMAL LOW (ref 22–32)
Calcium: 8.6 mg/dL — ABNORMAL LOW (ref 8.9–10.3)
Chloride: 112 mmol/L — ABNORMAL HIGH (ref 98–111)
Creatinine, Ser: 0.68 mg/dL (ref 0.44–1.00)
GFR calc Af Amer: >60 mL/min (ref >60)
GFR calc non Af Amer: >60 mL/min (ref >60)
Glucose, Bld: 91 mg/dL (ref 70–99)
Potassium: 3.8 mmol/L (ref 3.5–5.1)
Sodium: 138 mmol/L (ref 135–145)
Total Bilirubin: 0.5 mg/dL (ref 0.3–1.2)
Total Protein: 7 g/dL (ref 6.5–8.1)

## 2020-03-17 LAB — LIPASE, BLOOD: Lipase: 44 U/L (ref 11–51)

## 2020-03-17 NOTE — ED Notes (Signed)
Patient given update on wait time. Patient verbalizes understanding.  

## 2020-03-17 NOTE — ED Triage Notes (Signed)
Patient states that she woke up out of her sleep about 20 minutes ago with lower abdominal pain that radiated into her back. Patient states that has felt nauseated but denies vomiting.

## 2020-03-18 ENCOUNTER — Telehealth: Payer: Self-pay | Admitting: Emergency Medicine

## 2020-03-18 NOTE — Telephone Encounter (Signed)
Called patient due to lwot to inquire about condition and follow up plans. No answer and mailbox is full. 

## 2020-04-15 ENCOUNTER — Ambulatory Visit: Payer: 59 | Admitting: Certified Nurse Midwife

## 2020-04-15 ENCOUNTER — Encounter: Payer: Self-pay | Admitting: Certified Nurse Midwife

## 2020-04-15 VITALS — BP 95/68 | HR 70 | Ht 65.0 in | Wt 213.5 lb

## 2020-04-15 DIAGNOSIS — F419 Anxiety disorder, unspecified: Secondary | ICD-10-CM

## 2020-04-15 DIAGNOSIS — Z30432 Encounter for removal of intrauterine contraceptive device: Secondary | ICD-10-CM

## 2020-04-15 MED ORDER — NORETHIN ACE-ETH ESTRAD-FE 1-20 MG-MCG PO TABS: 1.0000 | ORAL_TABLET | Freq: Every day | ORAL | 11 refills | Status: DC

## 2020-04-15 NOTE — Progress Notes (Signed)
    GYNECOLOGY OFFICE PROCEDURE NOTE  Adrienne Alvarado is a 29 y.o. G1P0 here for Mirena IUD removal. No GYN concerns.  Last pap smear was on 12/06/2017 and was normal. She states her bleeding has has continued to be irregular since placement. She has weight gain and mood changes. She states she has taken zoloft in the past but did not like how she felt and made her gain weight.   IUD Removal  Patient identified, informed consent performed, consent signed.  Patient was in the dorsal lithotomy position, normal external genitalia was noted.  A speculum was placed in the patient's vagina, normal discharge was noted, no lesions. The cervix was visualized, no lesions, no abnormal discharge.  The strings of the IUD were grasped and pulled using ring forceps. The IUD was removed in its entirety.  Patient tolerated the procedure well.    Patient will use the birth control pill  for contraception. Referral to psychiatry for anxiety .  Routine preventative health maintenance measures emphasized.   Doreene Burke, CNM

## 2020-10-14 ENCOUNTER — Other Ambulatory Visit: Payer: Self-pay

## 2020-10-14 ENCOUNTER — Ambulatory Visit
Admission: EM | Admit: 2020-10-14 | Discharge: 2020-10-14 | Disposition: A | Discharge status: Home / Self Care | Payer: 59 | Attending: Family Medicine | Admitting: Family Medicine

## 2020-10-14 DIAGNOSIS — N10 Acute tubulo-interstitial nephritis: Secondary | ICD-10-CM

## 2020-10-14 LAB — POCT URINALYSIS DIP (MANUAL ENTRY)
Bilirubin, UA: NEGATIVE
Glucose, UA: NEGATIVE mg/dL
Nitrite, UA: NEGATIVE
Protein Ur, POC: 100 mg/dL — AB
Spec Grav, UA: 1.025 (ref 1.010–1.025)
Urobilinogen, UA: 0.2 E.U./dL
pH, UA: 6 (ref 5.0–8.0)

## 2020-10-14 LAB — POCT URINE PREGNANCY: Preg Test, Ur: NEGATIVE

## 2020-10-14 MED ORDER — CEFTRIAXONE SODIUM 1 G IJ SOLR
1.0000 g | Freq: Once | INTRAMUSCULAR | Status: AC
  Administered 2020-10-14: 1 g via INTRAMUSCULAR

## 2020-10-14 MED ORDER — KETOROLAC TROMETHAMINE 30 MG/ML IJ SOLN
30.0000 mg | Freq: Once | INTRAMUSCULAR | Status: AC
  Administered 2020-10-14: 30 mg via INTRAMUSCULAR

## 2020-10-14 MED ORDER — SULFAMETHOXAZOLE-TRIMETHOPRIM 800-160 MG PO TABS
1.0000 | ORAL_TABLET | Freq: Two times a day (BID) | ORAL | 0 refills | Status: AC
Start: 2020-10-14 — End: 2020-10-21

## 2020-10-14 MED ORDER — ONDANSETRON 4 MG PO TBDP
4.0000 mg | ORAL_TABLET | Freq: Once | ORAL | Status: AC
  Administered 2020-10-14: 4 mg via ORAL

## 2020-10-14 MED ORDER — ONDANSETRON 4 MG PO TBDP
4.0000 mg | ORAL_TABLET | Freq: Three times a day (TID) | ORAL | 0 refills | Status: DC | PRN
Start: 2020-10-14 — End: 2020-10-15

## 2020-10-14 NOTE — ED Triage Notes (Signed)
Patient presents to Urgent Care with complaints of possible kidney infection. Pt reports dysuria, right flank pain, and lower abdominal pain since Thursday, hematuria, nausea, and fever (100.6) since yesterday. Treating symptoms with AZO and ibuprofen (last dose 2 hrs ago). Pt reports hx of kidney infection.

## 2020-10-14 NOTE — ED Provider Notes (Signed)
Adrienne Alvarado    CSN: [id number] Arrival date & time: 10/14/20  4098      History   Chief Complaint No chief complaint on file.   HPI Adrienne Alvarado is a 30 y.o. female.   Patient is a 30 year old female presents today with possible kidney infection.  She is having right flank pain, dysuria, lower abdominal pain since Thursday.  She is also has some associated hematuria, nausea and fever since yesterday.  Fever of 100.6 at home.  Treating her symptoms with AZO and ibuprofen last 2 hours ago.  History of pyelonephritis in the past.  No vomiting.  Able to drink fluids.     Past Medical History:  Diagnosis Date  . Hypothyroid   . PCOS (polycystic ovarian syndrome)   . PCOS (polycystic ovarian syndrome)   . PONV (postoperative nausea and vomiting)   . Pregnancy 02/26/2018  . Umbilical hernia     Patient Active Problem List   Diagnosis Date Noted  . Anxiety 09/26/2018    Past Surgical History:  Procedure Laterality Date  . APPENDECTOMY    . UMBILICAL HERNIA REPAIR N/A 07/11/2018   Procedure: HERNIA REPAIR UMBILICAL ADULT WITH MESH;  Surgeon: Ancil Linsey, MD;  Location: [facility] ORS;  Service: Vascular;  Laterality: N/A;    OB History    Gravida  1   Para      Term      Preterm      AB      Living        SAB      IAB      Ectopic      Multiple      Live Births               Home Medications    Prior to Admission medications   Medication Sig Start Date End Date Taking? Authorizing Provider  ondansetron (ZOFRAN ODT) 4 MG disintegrating tablet Take 1 tablet (4 mg total) by mouth every 8 (eight) hours as needed for nausea or vomiting. 10/14/20  Yes Nehan Flaum A, NP  sulfamethoxazole-trimethoprim (BACTRIM DS) 800-160 MG tablet Take 1 tablet by mouth 2 (two) times daily for 7 days. 10/14/20 10/21/20 Yes Naziyah Tieszen A, NP  norethindrone-ethinyl estradiol (JUNEL FE 1/20) 1-20 MG-MCG tablet Take 1 tablet by mouth daily. 04/15/20   Doreene Burke, CNM  Oxcarbazepine (TRILEPTAL) 300 MG tablet Take 1 tablet (300 mg total) by mouth 2 (two) times daily. You can increase this to 2 tablets by mouth in the evening and 1 in the morning after 3 days.  If this is not helping you can increase it to 2 tablets twice a day and 3 more days.  Common side effects are nausea vomiting, dizziness and blurry vision. Patient not taking: Reported on 04/15/2020 03/17/19   Arnaldo Natal, MD  sertraline (ZOLOFT) 25 MG tablet Take 1 tablet (25 mg total) by mouth daily. Patient not taking: No sig reported 09/26/18   Doreene Burke, CNM    Family History Family History  Problem Relation Age of Onset  . Cancer Mother        breast  . Diabetes Mother        type 2  . Multiple sclerosis Mother   . Breast cancer Mother   . Ovarian cancer Neg Hx   . Colon cancer Neg Hx     Social History Social History   Tobacco Use  . Smoking status: Former Smoker    Packs/day:  0.25    Years: 2.00    Pack years: 0.50    Types: Cigarettes    Quit date: 02/03/2016    Years since quitting: 4.6  . Smokeless tobacco: Never Used  Vaping Use  . Vaping Use: Never used  Substance Use Topics  . Alcohol use: Not Currently  . Drug use: No     Allergies   Amoxicillin, Muscle relief [capsaicin], Penicillins, and Methocarbamol   Review of Systems Review of Systems   Physical Exam Triage Vital Signs ED Triage Vitals  Enc Vitals Group     BP 10/14/20 0934 112/80     Pulse Rate 10/14/20 0934 92     Resp 10/14/20 0934 18     Temp 10/14/20 0934 98.9 F (37.2 C)     Temp Source 10/14/20 0934 Oral     SpO2 10/14/20 0934 97 %     Weight --      Height --      Head Circumference --      Peak Flow --      Pain Score 10/14/20 0940 7     Pain Loc --      Pain Edu? --      Excl. in [place]? --    No data found.  Updated Vital Signs BP 112/80 (BP Location: Right Arm)   Pulse 92   Temp 98.9 F (37.2 C) (Oral)   Resp 18   SpO2 97%   Visual Acuity Right Eye  Distance:   Left Eye Distance:   Bilateral Distance:    Right Eye Near:   Left Eye Near:    Bilateral Near:     Physical Exam Vitals and nursing note reviewed.  Constitutional:      General: She is not in acute distress.    Appearance: Normal appearance. She is not ill-appearing, toxic-appearing or diaphoretic.  HENT:     Head: Normocephalic.  Eyes:     Conjunctiva/sclera: Conjunctivae normal.  Pulmonary:     Effort: Pulmonary effort is normal.  Abdominal:     Palpations: Abdomen is soft.     Tenderness: There is no abdominal tenderness. There is right CVA tenderness.  Musculoskeletal:        General: Normal range of motion.     Cervical back: Normal range of motion.  Skin:    General: Skin is warm and dry.     Findings: No rash.  Neurological:     Mental Status: She is alert.  Psychiatric:        Mood and Affect: Mood normal.      UC Treatments / Results  Labs (all labs ordered are listed, but only abnormal results are displayed) Labs Reviewed  POCT URINALYSIS DIP (MANUAL ENTRY) - Abnormal; Notable for the following components:      Result Value   Clarity, UA cloudy (*)    Ketones, POC UA small (15) (*)    Blood, UA large (*)    Protein Ur, POC =100 (*)    Leukocytes, UA Large (3+) (*)    All other components within normal limits  URINE CULTURE  POCT URINE PREGNANCY    EKG   Radiology No results found.  Procedures Procedures (including critical care time)  Medications Ordered in UC Medications  cefTRIAXone (ROCEPHIN) injection 1 g (has no administration in time range)  ketorolac (TORADOL) 30 MG/ML injection 30 mg (has no administration in time range)  ondansetron (ZOFRAN-ODT) disintegrating tablet 4 mg (has no administration in time range)  Initial Impression / Assessment and Plan / UC Course  I have reviewed the triage vital signs and the nursing notes.  Pertinent labs & imaging results that were available during my care of the patient were  reviewed by me and considered in my medical decision making (see chart for details).     Pyelonephritis Most likely diagnosis based on symptoms and urinalysis.  Patient with large leuks, large blood and small ketones with cloudy clarity.  Sending for culture. Will treat aggressively here in clinic and gave 1 g of Rocephin, Toradol for pain and Zofran for nausea. Sending Bactrim to the pharmacy to treat infection Zofran for nausea as needed.  Recommended push fluids. ER precautions given Final Clinical Impressions(s) / UC Diagnoses   Final diagnoses:  Acute pyelonephritis     Discharge Instructions     We are treating you for a kidney infection.  We gave you antibiotics here today and Toradol for pain, Zofran for nausea Make sure you are staying hydrated.  I have sent more antibiotics and nausea medication to the pharmacy to take For any worsening problems you will need to go to the ER.     ED Prescriptions    Medication Sig Dispense Auth. Provider   sulfamethoxazole-trimethoprim (BACTRIM DS) 800-160 MG tablet Take 1 tablet by mouth 2 (two) times daily for 7 days. 14 tablet Baker Moronta A, NP   ondansetron (ZOFRAN ODT) 4 MG disintegrating tablet Take 1 tablet (4 mg total) by mouth every 8 (eight) hours as needed for nausea or vomiting. 20 tablet Dahlia Byes A, NP     PDMP not reviewed this encounter.   Dahlia Byes A, NP 10/14/20 1010

## 2020-10-14 NOTE — Discharge Instructions (Addendum)
We are treating you for a kidney infection.  We gave you antibiotics here today and Toradol for pain, Zofran for nausea Make sure you are staying hydrated.  I have sent more antibiotics and nausea medication to the pharmacy to take For any worsening problems you will need to go to the ER.

## 2020-10-15 ENCOUNTER — Emergency Department: Payer: 59

## 2020-10-15 ENCOUNTER — Emergency Department
Admission: EM | Admit: 2020-10-15 | Discharge: 2020-10-15 | Disposition: A | Discharge status: Home / Self Care | Payer: 59 | Attending: Emergency Medicine | Admitting: Emergency Medicine

## 2020-10-15 ENCOUNTER — Other Ambulatory Visit: Payer: Self-pay

## 2020-10-15 DIAGNOSIS — R10823 Right lower quadrant rebound abdominal tenderness: Secondary | ICD-10-CM | POA: Insufficient documentation

## 2020-10-15 DIAGNOSIS — R10824 Left lower quadrant rebound abdominal tenderness: Secondary | ICD-10-CM | POA: Insufficient documentation

## 2020-10-15 DIAGNOSIS — E039 Hypothyroidism, unspecified: Secondary | ICD-10-CM | POA: Diagnosis not present

## 2020-10-15 DIAGNOSIS — N12 Tubulo-interstitial nephritis, not specified as acute or chronic: Secondary | ICD-10-CM

## 2020-10-15 DIAGNOSIS — Z87891 Personal history of nicotine dependence: Secondary | ICD-10-CM | POA: Diagnosis not present

## 2020-10-15 DIAGNOSIS — N10 Acute tubulo-interstitial nephritis: Secondary | ICD-10-CM | POA: Diagnosis not present

## 2020-10-15 DIAGNOSIS — M791 Myalgia, unspecified site: Secondary | ICD-10-CM | POA: Diagnosis not present

## 2020-10-15 DIAGNOSIS — M545 Low back pain, unspecified: Secondary | ICD-10-CM | POA: Diagnosis present

## 2020-10-15 LAB — URINALYSIS, COMPLETE (UACMP) WITH MICROSCOPIC
Bilirubin Urine: NEGATIVE
Glucose, UA: NEGATIVE mg/dL
Hgb urine dipstick: NEGATIVE
Ketones, ur: 20 mg/dL — AB
Nitrite: NEGATIVE
Protein, ur: 30 mg/dL — AB
Specific Gravity, Urine: 1.014 (ref 1.005–1.030)
WBC, UA: >50 WBC/hpf — ABNORMAL HIGH (ref 0–5)
pH: 5 (ref 5.0–8.0)

## 2020-10-15 LAB — BASIC METABOLIC PANEL
Anion gap: 11 (ref 5–15)
BUN: 7 mg/dL (ref 6–20)
CO2: 23 mmol/L (ref 22–32)
Calcium: 8.8 mg/dL — ABNORMAL LOW (ref 8.9–10.3)
Chloride: 101 mmol/L (ref 98–111)
Creatinine, Ser: 0.84 mg/dL (ref 0.44–1.00)
GFR, Estimated: >60 mL/min (ref >60)
Glucose, Bld: 95 mg/dL (ref 70–99)
Potassium: 4.3 mmol/L (ref 3.5–5.1)
Sodium: 135 mmol/L (ref 135–145)

## 2020-10-15 LAB — CBC
HCT: 35.1 % — ABNORMAL LOW (ref 36.0–46.0)
Hemoglobin: 11.2 g/dL — ABNORMAL LOW (ref 12.0–15.0)
MCH: 28.9 pg (ref 26.0–34.0)
MCHC: 31.9 g/dL (ref 30.0–36.0)
MCV: 90.7 fL (ref 80.0–100.0)
Platelets: 428 10*3/uL — ABNORMAL HIGH (ref 150–400)
RBC: 3.87 MIL/uL (ref 3.87–5.11)
RDW: 13 % (ref 11.5–15.5)
WBC: 15.7 10*3/uL — ABNORMAL HIGH (ref 4.0–10.5)
nRBC: 0 % (ref 0.0–0.2)

## 2020-10-15 LAB — POC URINE PREG, ED: Preg Test, Ur: NEGATIVE

## 2020-10-15 LAB — LIPASE, BLOOD: Lipase: 30 U/L (ref 11–51)

## 2020-10-15 LAB — SPECIMEN STATUS REPORT

## 2020-10-15 MED ORDER — ONDANSETRON HCL 4 MG/2ML IJ SOLN
4.0000 mg | Freq: Once | INTRAMUSCULAR | Status: AC
  Administered 2020-10-15: 4 mg via INTRAVENOUS
  Filled 2020-10-15: qty 2

## 2020-10-15 MED ORDER — LACTATED RINGERS IV BOLUS
1000.0000 mL | Freq: Once | INTRAVENOUS | Status: AC
  Administered 2020-10-15: 1000 mL via INTRAVENOUS

## 2020-10-15 MED ORDER — ONDANSETRON 4 MG PO TBDP
ORAL_TABLET | ORAL | Status: AC
  Administered 2020-10-15: 4 mg via ORAL
  Filled 2020-10-15: qty 1

## 2020-10-15 MED ORDER — ONDANSETRON 4 MG PO TBDP: 4.0000 mg | ORAL_TABLET | Freq: Once | ORAL | Status: AC

## 2020-10-15 MED ORDER — ACETAMINOPHEN 500 MG PO TABS
1000.0000 mg | ORAL_TABLET | Freq: Once | ORAL | Status: AC
  Administered 2020-10-15: 1000 mg via ORAL
  Filled 2020-10-15: qty 2

## 2020-10-15 MED ORDER — OXYCODONE-ACETAMINOPHEN 5-325 MG PO TABS: 1.0000 | ORAL_TABLET | Freq: Three times a day (TID) | ORAL | 0 refills | Status: AC | PRN

## 2020-10-15 MED ORDER — IOHEXOL 300 MG/ML  SOLN
100.0000 mL | Freq: Once | INTRAMUSCULAR | Status: AC | PRN
  Administered 2020-10-15: 100 mL via INTRAVENOUS
  Filled 2020-10-15: qty 100

## 2020-10-15 MED ORDER — SODIUM CHLORIDE 0.9 % IV SOLN
1.0000 g | Freq: Once | INTRAVENOUS | Status: AC
  Administered 2020-10-15: 1 g via INTRAVENOUS
  Filled 2020-10-15: qty 10

## 2020-10-15 MED ORDER — KETOROLAC TROMETHAMINE 30 MG/ML IJ SOLN
30.0000 mg | Freq: Once | INTRAMUSCULAR | Status: AC
  Administered 2020-10-15: 30 mg via INTRAVENOUS
  Filled 2020-10-15: qty 1

## 2020-10-15 MED ORDER — HYDROMORPHONE HCL 1 MG/ML IJ SOLN
1.0000 mg | Freq: Once | INTRAMUSCULAR | Status: AC
  Administered 2020-10-15: 1 mg via INTRAVENOUS
  Filled 2020-10-15: qty 1

## 2020-10-15 MED ORDER — ONDANSETRON 4 MG PO TBDP
4.0000 mg | ORAL_TABLET | Freq: Three times a day (TID) | ORAL | 0 refills | Status: DC | PRN
Start: 2020-10-15 — End: 2023-09-13

## 2020-10-15 NOTE — ED Triage Notes (Signed)
Pt comes pov with right sided flank pain for a week. Pt was seen and treated for urine infection/blood in urine at urgent care yesterday but flank pain is getting worse and pt has been vomiting.

## 2020-10-15 NOTE — ED Notes (Signed)
Pt had zofran on the way here

## 2020-10-15 NOTE — ED Provider Notes (Signed)
[facility] Emergency Department Provider Note  ____________________________________________   Event Date/Time   First MD Initiated Contact with Patient 10/15/20 1308     (approximate)  I have reviewed the triage vital signs and the nursing notes.   HISTORY  Chief Complaint Flank Pain   HPI Adrienne Alvarado is a 30 y.o. female presents medical history of hypothyroidism, PCOS, and remote history of pyelonephritis who presents for assessment approximately 6 to 7 days of right lower back pain associate with burning with urination, blood in the urine, myalgias, chills, and nonbloody nonbilious emesis.  She notes she was seen in urgent care yesterday where she received a shot of IM Rocephin Zofran and diagnosis of pyelonephritis.  She states she was able to take Bactrim that they prescribed but has still nauseous with the Zofran.  She states her pain has not improved at all in her right lower back but her brain with urination has improved.  She denies any headache, earache, sore throat, fevers, chest pain, cough, shortness of breath, left-sided back pain, any significant abdominal pain other than at the right flank and right lower back, rash or extremity pain.  No recent falls or injuries.  No clear alleviating aggravating factors otherwise.         Past Medical History:  Diagnosis Date  . Hypothyroid   . PCOS (polycystic ovarian syndrome)   . PCOS (polycystic ovarian syndrome)   . PONV (postoperative nausea and vomiting)   . Pregnancy 02/26/2018  . Umbilical hernia     Patient Active Problem List   Diagnosis Date Noted  . Anxiety 09/26/2018    Past Surgical History:  Procedure Laterality Date  . APPENDECTOMY    . UMBILICAL HERNIA REPAIR N/A 07/11/2018   Procedure: HERNIA REPAIR UMBILICAL ADULT WITH MESH;  Surgeon: Ancil Linsey, MD;  Location: [facility] ORS;  Service: Vascular;  Laterality: N/A;    Prior to Admission medications   Medication Sig Start  Date End Date Taking? Authorizing Provider  oxyCODONE-acetaminophen (PERCOCET) 5-325 MG tablet Take 1 tablet by mouth every 8 (eight) hours as needed for up to 5 days for severe pain. 10/15/20 10/20/20 Yes Gilles Chiquito, MD  norethindrone-ethinyl estradiol (JUNEL FE 1/20) 1-20 MG-MCG tablet Take 1 tablet by mouth daily. 04/15/20   Doreene Burke, CNM  ondansetron (ZOFRAN ODT) 4 MG disintegrating tablet Take 1 tablet (4 mg total) by mouth every 8 (eight) hours as needed for nausea or vomiting. 10/15/20   Gilles Chiquito, MD  Oxcarbazepine (TRILEPTAL) 300 MG tablet Take 1 tablet (300 mg total) by mouth 2 (two) times daily. You can increase this to 2 tablets by mouth in the evening and 1 in the morning after 3 days.  If this is not helping you can increase it to 2 tablets twice a day and 3 more days.  Common side effects are nausea vomiting, dizziness and blurry vision. Patient not taking: Reported on 04/15/2020 03/17/19   Arnaldo Natal, MD  sertraline (ZOLOFT) 25 MG tablet Take 1 tablet (25 mg total) by mouth daily. Patient not taking: No sig reported 09/26/18   Doreene Burke, CNM  sulfamethoxazole-trimethoprim (BACTRIM DS) 800-160 MG tablet Take 1 tablet by mouth 2 (two) times daily for 7 days. 10/14/20 10/21/20  Dahlia Byes A, NP    Allergies Amoxicillin, Muscle relief [capsaicin], Penicillins, and Methocarbamol  Family History  Problem Relation Age of Onset  . Cancer Mother        breast  . Diabetes  Mother        type 2  . Multiple sclerosis Mother   . Breast cancer Mother   . Ovarian cancer Neg Hx   . Colon cancer Neg Hx     Social History Social History   Tobacco Use  . Smoking status: Former Smoker    Packs/day: 0.25    Years: 2.00    Pack years: 0.50    Types: Cigarettes    Quit date: 02/03/2016    Years since quitting: 4.7  . Smokeless tobacco: Never Used  Vaping Use  . Vaping Use: Never used  Substance Use Topics  . Alcohol use: Not Currently  . Drug use: No     Review of Systems  Review of Systems  Constitutional: Positive for chills and malaise/fatigue. Negative for fever.  HENT: Negative for sore throat.   Eyes: Negative for pain.  Respiratory: Negative for cough and stridor.   Cardiovascular: Negative for chest pain.  Gastrointestinal: Positive for nausea and vomiting.  Genitourinary: Positive for frequency.  Musculoskeletal: Positive for back pain.  Skin: Negative for rash.  Neurological: Negative for seizures, loss of consciousness and headaches.  Psychiatric/Behavioral: Negative for suicidal ideas.  All other systems reviewed and are negative.     ____________________________________________   PHYSICAL EXAM:  VITAL SIGNS: ED Triage Vitals  Enc Vitals Group     BP 10/15/20 1015 126/89     Pulse Rate 10/15/20 1015 87     Resp 10/15/20 1015 18     Temp 10/15/20 1015 98.3 F (36.8 C)     Temp Source 10/15/20 1015 Oral     SpO2 10/15/20 1015 100 %     Weight 10/15/20 1010 200 lb (90.7 kg)     Height 10/15/20 1010  (1.651 m)     Head Circumference --      Peak Flow --      Pain Score 10/15/20 1010 6     Pain Loc --      Pain Edu? --      Excl. in GC? --    Vitals:   10/15/20 1015 10/15/20 1346  BP: 126/89 120/80  Pulse: 87 88  Resp: 18 16  Temp: 98.3 F (36.8 C)   SpO2: 100% 100%   Physical Exam Vitals and nursing note reviewed.  Constitutional:      General: She is not in acute distress.    Appearance: She is well-developed and well-nourished.  HENT:     Head: Normocephalic and atraumatic.     Right Ear: External ear normal.     Left Ear: External ear normal.     Nose: Nose normal.     Mouth/Throat:     Mouth: Mucous membranes are dry.  Eyes:     Conjunctiva/sclera: Conjunctivae normal.  Cardiovascular:     Rate and Rhythm: Normal rate and regular rhythm.     Heart sounds: No murmur heard.   Pulmonary:     Effort: Pulmonary effort is normal. No respiratory distress.     Breath sounds:  Normal breath sounds.  Abdominal:     Palpations: Abdomen is soft.     Tenderness: There is no abdominal tenderness. There is right CVA tenderness and left CVA tenderness.  Musculoskeletal:        General: No deformity or edema.     Cervical back: Neck supple.  Skin:    General: Skin is warm and dry.     Capillary Refill: Capillary refill takes 2 to 3  seconds.  Neurological:     Mental Status: She is alert and oriented to person, place, and time.  Psychiatric:        Mood and Affect: Mood and affect and mood normal.      ____________________________________________   LABS (all labs ordered are listed, but only abnormal results are displayed)  Labs Reviewed  URINALYSIS, COMPLETE (UACMP) WITH MICROSCOPIC - Abnormal; Notable for the following components:      Result Value   Color, Urine YELLOW (*)    APPearance HAZY (*)    Ketones, ur 20 (*)    Protein, ur 30 (*)    Leukocytes,Ua MODERATE (*)    WBC, UA >50 (*)    Bacteria, UA FEW (*)    All other components within normal limits  BASIC METABOLIC PANEL - Abnormal; Notable for the following components:   Calcium 8.8 (*)    All other components within normal limits  CBC - Abnormal; Notable for the following components:   WBC 15.7 (*)    Hemoglobin 11.2 (*)    HCT 35.1 (*)    Platelets 428 (*)    All other components within normal limits  LIPASE, BLOOD  POC URINE PREG, ED   ____________________________________________  EKG  ____________________________________________  RADIOLOGY  ED MD interpretation: Mild right hydronephrosis and proximal right hydroureter with some perinephric and proximal periureteric edema.  No kidney stone.  No other acute intra-abdominal pelvic pathology.   Official radiology report(s): CT ABDOMEN PELVIS W CONTRAST  Result Date: 10/15/2020 CLINICAL DATA:  Abdominal pain. EXAM: CT ABDOMEN AND PELVIS WITH CONTRAST TECHNIQUE: Multidetector CT imaging of the abdomen and pelvis was performed using  the standard protocol following bolus administration of intravenous contrast. CONTRAST:  100mL OMNIPAQUE IOHEXOL 300 MG/ML  SOLN COMPARISON:  None. FINDINGS: Lower chest: Chest unremarkable. Hepatobiliary: No suspicious focal abnormality within the liver parenchyma. There is no evidence for gallstones, gallbladder wall thickening, or pericholecystic fluid. No intrahepatic or extrahepatic biliary dilation. Pancreas: No focal mass lesion. No dilatation of the main duct. No intraparenchymal cyst. No peripancreatic edema. Spleen: No splenomegaly. No focal mass lesion. Adrenals/Urinary Tract: No adrenal nodule or mass. Asymmetrically decreased perfusion noted in the right kidney with mild right hydronephrosis and proximal right hydroureter. There is perinephric and proximal periureteric edema. No obstructing stone is perceptible in the right ureter, at the right UVJ, or in the bladder lumen. Left kidney and ureter unremarkable. Stomach/Bowel: Stomach is unremarkable. No gastric wall thickening. No evidence of outlet obstruction. Duodenum is normally positioned as is the ligament of Treitz. No small bowel wall thickening. No small bowel dilatation. The appendix is not well visualized, but there is no edema or inflammation in the region of the cecum. No gross colonic mass. No colonic wall thickening. Vascular/Lymphatic: No abdominal aortic aneurysm. No abdominal aortic atherosclerotic calcification. There is no gastrohepatic or hepatoduodenal ligament lymphadenopathy. No retroperitoneal or mesenteric lymphadenopathy. No pelvic sidewall lymphadenopathy. Reproductive: The uterus is unremarkable.  There is no adnexal mass. Other: No intraperitoneal free fluid. Musculoskeletal: No worrisome lytic or sclerotic osseous abnormality. IMPRESSION: 1. Mild right hydronephrosis and proximal right hydroureter with perinephric and proximal periureteric edema. No obstructing stone is perceptible in the right ureter, at the right UVJ, or  in the bladder lumen. Decreased perfusion suggests obstructive uropathy and findings may be related to recent stone passage. Right-sided urinary tract infection also a consideration. 2. Otherwise unremarkable exam. Electronically Signed   By: Kennith CenterEric  Mansell M.D.   On: 10/15/2020 14:49  ____________________________________________   PROCEDURES  Procedure(s) performed (including Critical Care):  .1-3 Lead EKG Interpretation Performed by: Gilles Chiquito, MD Authorized by: Gilles Chiquito, MD     Interpretation: normal     ECG rate assessment: normal     Rhythm: sinus rhythm     Ectopy: none     Conduction: normal       ____________________________________________   INITIAL IMPRESSION / ASSESSMENT AND PLAN / ED COURSE      Patient presents with above history exam for assessment 5 6 days of right lower back pain associate with nonbloody nonbilious emesis malaise fevers and recent diagnosis of pyelonephritis yesterday urgent care.  On arrival she is afebrile and hemodynamically stable.  She notes her burning with urination has improved since yesterday but that her right lower back pain has not improved with the Toradol she was given yesterday and she has been taking ibuprofen Tylenol significant provement.  UA today does appear infected and I reviewed yesterday's UA which also appears infected.  Urine culture was sent from yesterday's UA.  Given CVA tenderness patient clearly has a clinical diagnosis of pyelonephritis.  However CT obtained to assess for evidence of stone or abscess.  CT obtained shows no evidence of stone or abscess or any other clear acute intra-abdominal pelvic pathology including diverticulitis, appendicitis, torsion, cholecystitis or pancreatitis.  BMP is unremarkable kidney function is within normal limits.  CBC shows leukocytosis with WBC count of 15.7 and hemoglobin at baseline 11.2 compared to 12.57 months ago.  Urine pregnancy test is negative.  Overall very  low suspicion for sepsis or bacteremia at this time.  While undergoing CT patient was given IV fluids, Zofran, analgesia, and a dose of Rocephin.  However given reassuring CT with otherwise reassuring vitals and labs I believe she is safe for discharge with plan for outpatient follow-up.  On my reassessment she stated her pain felt better.  Advised her to complete her course of Bactrim prescribed yesterday at urgent care.  She stated she was not sure how much Zofran she had so she was given a refill of this prescription.  He was also given a prescription for Percocet given severe pain associated with her pyelonephritis not improved with OTC Tylenol and ibuprofen.  Discharged stable condition.  Return cautions advised and discussed.    ____________________________________________   FINAL CLINICAL IMPRESSION(S) / ED DIAGNOSES  Final diagnoses:  Pyelonephritis    Medications  lactated ringers bolus 1,000 mL (1,000 mLs Intravenous New Bag/Given 10/15/20 1339)  ondansetron (ZOFRAN) injection 4 mg (4 mg Intravenous Given 10/15/20 1339)  ketorolac (TORADOL) 30 MG/ML injection 30 mg (30 mg Intravenous Given 10/15/20 1340)  HYDROmorphone (DILAUDID) injection 1 mg (1 mg Intravenous Given 10/15/20 1341)  cefTRIAXone (ROCEPHIN) 1 g in sodium chloride 0.9 % 100 mL IVPB (1 g Intravenous New Bag/Given 10/15/20 1341)  acetaminophen (TYLENOL) tablet 1,000 mg (1,000 mg Oral Given 10/15/20 1341)  iohexol (OMNIPAQUE) 300 MG/ML solution 100 mL (100 mLs Intravenous Contrast Given 10/15/20 1414)     ED Discharge Orders         Ordered    oxyCODONE-acetaminophen (PERCOCET) 5-325 MG tablet  Every 8 hours PRN        10/15/20 1500    ondansetron (ZOFRAN ODT) 4 MG disintegrating tablet  Every 8 hours PRN        10/15/20 1500           Note:  This document was prepared using Dragon voice recognition software and  may include unintentional dictation errors.   Gilles Chiquito, MD 10/15/20 [phone]

## 2020-10-15 NOTE — ED Notes (Signed)
See triage note  Presents with abd pain  States she developed some dysuria about 1 week ago   Then pain became worse yesterday  Was seen and placed on antibiotics  Pain is worse this am  Pos n/v

## 2020-10-17 LAB — URINE CULTURE

## 2021-03-08 ENCOUNTER — Other Ambulatory Visit: Payer: Self-pay | Admitting: Certified Nurse Midwife

## 2021-03-10 ENCOUNTER — Other Ambulatory Visit: Payer: Self-pay

## 2021-03-31 ENCOUNTER — Other Ambulatory Visit: Payer: Self-pay | Admitting: Certified Nurse Midwife

## 2021-03-31 NOTE — Telephone Encounter (Signed)
Patient called and scheduled her Physical for 05/07/21 2:30 if we could send in 1 month of her birth control to [facility] in [place]

## 2021-03-31 NOTE — Telephone Encounter (Signed)
LVM with patient informing her that she will need to schedule an appointment for an annual to get her Rx refilled; told her to call and schedule an appointment and let me know when she has and I will send in Rx to hold her over.

## 2021-04-01 ENCOUNTER — Other Ambulatory Visit: Payer: Self-pay

## 2021-04-01 MED ORDER — NORETHIN ACE-ETH ESTRAD-FE 1-20 MG-MCG PO TABS: 1.0000 | ORAL_TABLET | Freq: Every day | ORAL | 0 refills | Status: DC

## 2021-04-11 ENCOUNTER — Telehealth: Payer: Self-pay | Admitting: Certified Nurse Midwife

## 2021-04-11 NOTE — Telephone Encounter (Signed)
Pt's physical had to be moved out to 9-20- pt states that she would need another refill on birth control in the mean time/ Please Advise.

## 2021-04-14 ENCOUNTER — Other Ambulatory Visit: Payer: Self-pay

## 2021-04-14 MED ORDER — NORETHIN ACE-ETH ESTRAD-FE 1-20 MG-MCG PO TABS: 1.0000 | ORAL_TABLET | Freq: Every day | ORAL | 0 refills | Status: DC

## 2021-04-14 NOTE — Telephone Encounter (Signed)
Rx sent in for 1 mo., patient made aware.

## 2021-04-22 ENCOUNTER — Other Ambulatory Visit: Payer: Self-pay | Admitting: Certified Nurse Midwife

## 2021-05-07 ENCOUNTER — Encounter: Payer: 59 | Admitting: Certified Nurse Midwife

## 2021-05-13 ENCOUNTER — Other Ambulatory Visit: Payer: Self-pay | Admitting: Certified Nurse Midwife

## 2021-05-20 ENCOUNTER — Other Ambulatory Visit: Payer: Self-pay

## 2021-05-20 ENCOUNTER — Other Ambulatory Visit (HOSPITAL_COMMUNITY)
Admission: RE | Admit: 2021-05-20 | Discharge: 2021-05-20 | Disposition: A | Discharge status: Home / Self Care | Payer: 59 | Source: Ambulatory Visit | Attending: Certified Nurse Midwife | Admitting: Certified Nurse Midwife

## 2021-05-20 ENCOUNTER — Ambulatory Visit (INDEPENDENT_AMBULATORY_CARE_PROVIDER_SITE_OTHER): Payer: 59 | Admitting: Certified Nurse Midwife

## 2021-05-20 VITALS — BP 114/75 | HR 75 | Ht 65.0 in | Wt 222.1 lb

## 2021-05-20 DIAGNOSIS — Z124 Encounter for screening for malignant neoplasm of cervix: Secondary | ICD-10-CM | POA: Insufficient documentation

## 2021-05-20 DIAGNOSIS — Z01419 Encounter for gynecological examination (general) (routine) without abnormal findings: Secondary | ICD-10-CM | POA: Insufficient documentation

## 2021-05-20 DIAGNOSIS — E669 Obesity, unspecified: Secondary | ICD-10-CM | POA: Insufficient documentation

## 2021-05-20 MED ORDER — NORETHIN ACE-ETH ESTRAD-FE 1-20 MG-MCG PO TABS: 1.0000 | ORAL_TABLET | Freq: Every day | ORAL | 3 refills | Status: DC

## 2021-05-20 NOTE — Progress Notes (Signed)
GYNECOLOGY ANNUAL PREVENTATIVE CARE ENCOUNTER NOTE  History:     Adrienne Alvarado is a 30 y.o. G1P0 female here for a routine annual gynecologic exam.  Current complaints: none.   Denies abnormal vaginal bleeding, discharge, pelvic pain, problems with intercourse or other gynecologic concerns.     Social  Relationship: married Living: spouse and son Work: waxing  Exercise: 3-4 times a week  Smoke/Alcohol/drug use: Occasional alcohol use   Gynecologic History Patient's last menstrual period was 05/05/2021 (approximate). Contraception: OCP (estrogen/progesterone) Last Pap: 12/16/17. Results were: normal  Last mammogram: n/a .   Obstetric History OB History  Gravida Para Term Preterm AB Living  1            SAB IAB Ectopic Multiple Live Births               # Outcome Date GA Lbr Len/2nd Weight Sex Delivery Anes PTL Lv  1 Gravida             Past Medical History:  Diagnosis Date   Hypothyroid    PCOS (polycystic ovarian syndrome)    PCOS (polycystic ovarian syndrome)    PONV (postoperative nausea and vomiting)    Pregnancy 02/26/2018   Umbilical hernia     Past Surgical History:  Procedure Laterality Date   APPENDECTOMY     UMBILICAL HERNIA REPAIR N/A 07/11/2018   Procedure: HERNIA REPAIR UMBILICAL ADULT WITH MESH;  Surgeon: Ancil Linsey, MD;  Location: [facility] ORS;  Service: Vascular;  Laterality: N/A;    Current Outpatient Medications on File Prior to Visit  Medication Sig Dispense Refill   JUNEL FE 1/20 1-20 MG-MCG tablet TAKE 1 TABLET BY MOUTH EVERY DAY 28 tablet 0   Liraglutide -Weight Management (SAXENDA) 18 MG/3ML SOPN Inject into the skin.     ondansetron (ZOFRAN ODT) 4 MG disintegrating tablet Take 1 tablet (4 mg total) by mouth every 8 (eight) hours as needed for nausea or vomiting. 20 tablet 0   Oxcarbazepine (TRILEPTAL) 300 MG tablet Take 1 tablet (300 mg total) by mouth 2 (two) times daily. You can increase this to 2 tablets by mouth in the evening  and 1 in the morning after 3 days.  If this is not helping you can increase it to 2 tablets twice a day and 3 more days.  Common side effects are nausea vomiting, dizziness and blurry vision. (Patient not taking: No sig reported) 30 tablet 1   sertraline (ZOLOFT) 25 MG tablet Take 1 tablet (25 mg total) by mouth daily. (Patient not taking: No sig reported) 30 tablet 11   No current facility-administered medications on file prior to visit.    Allergies  Allergen Reactions   Amoxicillin Other (See Comments)    childhood   Muscle Relief [Capsaicin] Nausea And Vomiting   Penicillins Other (See Comments)    Has patient had a PCN reaction causing immediate rash, facial/tongue/throat swelling, SOB or lightheadedness with hypotension: Unknown Has patient had a PCN reaction causing severe rash involving mucus membranes or skin necrosis: Unknown Has patient had a PCN reaction that required hospitalization: Unknown Has patient had a PCN reaction occurring within the last 10 years: Unknown If all of the above answers are "NO", then may proceed with Cephalosporin use.  Childhood reaction   Methocarbamol Nausea Only, Anxiety and Other (See Comments)    Chills    Social History:  reports that she quit smoking about 5 years ago. Her smoking use included cigarettes. She has a  0.50 pack-year smoking history. She has never used smokeless tobacco. She reports that she does not currently use alcohol. She reports that she does not use drugs.  Family History  Problem Relation Age of Onset   Cancer Mother        breast   Diabetes Mother        type 2   Multiple sclerosis Mother    Breast cancer Mother    Ovarian cancer Neg Hx    Colon cancer Neg Hx     The following portions of the patient's history were reviewed and updated as appropriate: allergies, current medications, past family history, past medical history, past social history, past surgical history and problem list.  Review of  Systems Pertinent items noted in HPI and remainder of comprehensive ROS otherwise negative.  Physical Exam:  BP 114/75   Pulse 75   Ht 5\' 5"  (1.651 m)   Wt 222 lb 1.6 oz (100.7 kg)   LMP 05/05/2021 (Approximate)   BMI 36.96 kg/m  CONSTITUTIONAL: Well-developed, well-nourished female in no acute distress.  HENT:  Normocephalic, atraumatic, External right and left ear normal. Oropharynx is clear and moist EYES: Conjunctivae and EOM are normal. Pupils are equal, round, and reactive to light. No scleral icterus.  NECK: Normal range of motion, supple, no masses.  Normal thyroid.  SKIN: Skin is warm and dry. No rash noted. Not diaphoretic. No erythema. No pallor. MUSCULOSKELETAL: Normal range of motion. No tenderness.  No cyanosis, clubbing, or edema.  2+ distal pulses. NEUROLOGIC: Alert and oriented to person, place, and time. Normal reflexes, muscle tone coordination.  PSYCHIATRIC: Normal mood and affect. Normal behavior. Normal judgment and thought content. CARDIOVASCULAR: Normal heart rate noted, regular rhythm RESPIRATORY: Clear to auscultation bilaterally. Effort and breath sounds normal, no problems with respiration noted. BREASTS: Symmetric in size. No masses, tenderness, skin changes, nipple drainage, or lymphadenopathy bilaterally.  ABDOMEN: Soft, no distention noted.  No tenderness, rebound or guarding.  PELVIC: Normal appearing external genitalia and urethral meatus; normal appearing vaginal mucosa and cervix.  No abnormal discharge noted.  Pap smear obtained.  Normal uterine size, no other palpable masses, no uterine or adnexal tenderness.  .   Assessment and Plan:    1. Well woman exam with routine gynecological exam   Pap: Will follow up results of pap smear and manage accordingly. Mammogram : n/a  Labs: declines Refills: ocp Referral: none  Routine preventative health maintenance measures emphasized. Please refer to After Visit Summary for other counseling  recommendations.      07/05/2021, CNM [facility],  [facility]

## 2021-05-27 ENCOUNTER — Encounter: Payer: 59 | Admitting: Certified Nurse Midwife

## 2021-05-27 LAB — CYTOLOGY - PAP
Comment: NEGATIVE
Diagnosis: NEGATIVE
High risk HPV: NEGATIVE

## 2022-04-18 ENCOUNTER — Other Ambulatory Visit: Payer: Self-pay | Admitting: Certified Nurse Midwife

## 2023-04-03 ENCOUNTER — Other Ambulatory Visit: Payer: Self-pay | Admitting: Certified Nurse Midwife

## 2023-04-21 ENCOUNTER — Other Ambulatory Visit: Payer: Self-pay

## 2023-04-21 MED ORDER — NORETHIN ACE-ETH ESTRAD-FE 1-20 MG-MCG PO TABS: 1.0000 | ORAL_TABLET | Freq: Every day | ORAL | 0 refills | Status: DC

## 2023-06-02 ENCOUNTER — Ambulatory Visit: Payer: 59 | Admitting: Certified Nurse Midwife

## 2023-07-18 ENCOUNTER — Other Ambulatory Visit: Payer: Self-pay | Admitting: Certified Nurse Midwife

## 2023-08-04 ENCOUNTER — Other Ambulatory Visit: Payer: Self-pay

## 2023-08-04 MED ORDER — NORETHIN ACE-ETH ESTRAD-FE 1-20 MG-MCG PO TABS: 1.0000 | ORAL_TABLET | Freq: Every day | ORAL | 0 refills | Status: DC

## 2023-08-04 NOTE — Telephone Encounter (Signed)
Pt calling; is currently unable to sched annual d/t sched not ready; needs refill of bc - has four left.  [phone]  Called pt to confirm pharm which is [facility] in [place] and to let her know refill is being eRx'd.

## 2023-08-09 ENCOUNTER — Other Ambulatory Visit: Payer: Self-pay

## 2023-08-09 DIAGNOSIS — Z3041 Encounter for surveillance of contraceptive pills: Secondary | ICD-10-CM

## 2023-08-09 MED ORDER — NORETHIN ACE-ETH ESTRAD-FE 1-20 MG-MCG PO TABS: 1.0000 | ORAL_TABLET | Freq: Every day | ORAL | 0 refills | Status: DC

## 2023-09-13 ENCOUNTER — Encounter: Payer: Self-pay | Admitting: Certified Nurse Midwife

## 2023-09-13 ENCOUNTER — Ambulatory Visit (INDEPENDENT_AMBULATORY_CARE_PROVIDER_SITE_OTHER): Payer: 59 | Admitting: Certified Nurse Midwife

## 2023-09-13 VITALS — BP 113/75 | HR 74 | Ht 65.0 in | Wt 161.6 lb

## 2023-09-13 DIAGNOSIS — Z01419 Encounter for gynecological examination (general) (routine) without abnormal findings: Secondary | ICD-10-CM

## 2023-09-13 DIAGNOSIS — Z3041 Encounter for surveillance of contraceptive pills: Secondary | ICD-10-CM

## 2023-09-13 MED ORDER — NORETHIN ACE-ETH ESTRAD-FE 1-20 MG-MCG PO TABS: 1.0000 | ORAL_TABLET | Freq: Every day | ORAL | 3 refills | Status: AC

## 2023-09-13 NOTE — Patient Instructions (Signed)

## 2023-09-13 NOTE — Progress Notes (Signed)
 GYNECOLOGY ANNUAL PREVENTATIVE CARE ENCOUNTER NOTE  History:     Adrienne Alvarado is a 33 y.o. G1P0 female here for a routine annual gynecologic exam.  Current complaints: none.   Denies abnormal vaginal bleeding, discharge, pelvic pain, problems with intercourse or other gynecologic concerns.     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 Contraception: OCP (estrogen/progesterone) Last Pap: 05/20/2021. Results were: normal with negative HPV Last mammogram: n/a .    Upstream - 09/13/23 1339       Pregnancy Intention Screening   Does the patient want to become pregnant in the next year? No    Does the patient's partner want to become pregnant in the next year? No    Would the patient like to discuss contraceptive options today? No      Contraception Wrap Up   Current Method Oral Contraceptive    End Method Oral Contraceptive    Contraception Counseling Provided No            The pregnancy intention screening data noted above was reviewed. Potential methods of contraception were discussed. The patient elected to proceed with Oral Contraceptive.   Obstetric History OB History  Gravida Para Term Preterm AB Living  1       SAB IAB Ectopic Multiple Live Births          # Outcome Date GA Lbr Len/2nd Weight Sex Type Anes PTL Lv  1 Gravida             Past Medical History:  Diagnosis Date   Hypothyroid    PCOS (polycystic ovarian syndrome)    PCOS (polycystic ovarian syndrome)    PONV (postoperative nausea and vomiting)    Pregnancy 02/26/2018   Umbilical hernia     Past Surgical History:  Procedure Laterality Date   APPENDECTOMY     UMBILICAL HERNIA REPAIR N/A 07/11/2018   Procedure: HERNIA REPAIR UMBILICAL ADULT WITH MESH;  Surgeon: Nicholaus Selinda Birmingham, MD;  Location: [facility]  [facility];  Service: Vascular;  Laterality: N/A;   Breast lift & implants bilateral  Current Outpatient Medications on File Prior to Visit  Medication Sig Dispense Refill   norethindrone-ethinyl estradiol-FE (JUNEL FE 1/20) 1-20 MG-MCG tablet Take 1 tablet by mouth daily. 84 tablet 0   Omega-3 Fatty Acids (FISH OIL) 1000 MG CAPS 1 capsule Orally Once a day     Probiotic Product (PROBIOTIC ADVANCED PO) Probiotic     VITAMIN D, ERGOCALCIFEROL, PO Vitamin D     ZEPBOUND 7.5 MG/0.5ML Pen 7.5mg  Subcutaneous once weekly for 28 days     norethindrone-ethinyl estradiol (JUNEL 1/20) 1-20 MG-MCG tablet 1 tablet Orally Once a day     No current facility-administered medications on file prior to visit.    Allergies  Allergen Reactions   Amoxicillin Other (See Comments)    childhood   Muscle Relief [Capsaicin] Nausea And Vomiting   Penicillins Other (See Comments)    Has patient had a PCN reaction causing immediate rash, facial/tongue/throat swelling, SOB or lightheadedness with hypotension: Unknown Has patient had a PCN reaction causing severe rash involving mucus membranes or skin necrosis: Unknown Has patient had a PCN reaction that required hospitalization: Unknown Has patient had a PCN reaction occurring within the last 10 years: Unknown If all of the above answers are NO, then may proceed with  Cephalosporin use.  Childhood reaction   Methocarbamol Nausea Only, Anxiety and Other (See Comments)    Chills    Social History:  reports that she quit smoking about 7 years ago. Her smoking use included cigarettes. She started smoking about 9 years ago. She has a 0.5 pack-year smoking history. She has never used smokeless tobacco. She reports that she does not currently use alcohol. She reports that she does not use drugs.  Family History  Problem Relation Age of Onset   Cancer Mother        breast   Diabetes Mother        type 2   Multiple sclerosis Mother    Breast cancer Mother    Ovarian  cancer Neg Hx    Colon cancer Neg Hx     The following portions of the patient's history were reviewed and updated as appropriate: allergies, current medications, past family history, past medical history, past social history, past surgical history and problem list.  Review of Systems Pertinent items noted in HPI and remainder of comprehensive ROS otherwise negative.  Physical Exam:  BP 113/75   Pulse 74   Ht 5' 5 (1.651 m)   Wt 161 lb 9.6 oz (73.3 kg)   LMP 09/02/2023 (Exact Date)   BMI 26.89 kg/m  CONSTITUTIONAL: Well-developed, well-nourished female in no acute distress.  HENT:  Normocephalic, atraumatic, External right and left ear normal. Oropharynx is clear and moist EYES: Conjunctivae and EOM are normal. Pupils are equal, round, and reactive to light. No scleral icterus.  NECK: Normal range of motion, supple, no masses.  Normal thyroid .  SKIN: Skin is warm and dry. No rash noted. Not diaphoretic. No erythema. No pallor. MUSCULOSKELETAL: Normal range of motion. No tenderness.  No cyanosis, clubbing, or edema.  2+ distal pulses. NEUROLOGIC: Alert and oriented to person, place, and time. Normal reflexes, muscle tone coordination.  PSYCHIATRIC: Normal mood and affect. Normal behavior. Normal judgment and thought content. CARDIOVASCULAR: Normal heart rate noted, regular rhythm RESPIRATORY: Clear to auscultation bilaterally. Effort and breath sounds normal, no problems with respiration noted. BREASTS: Symmetric in size. No masses, tenderness, skin changes, nipple drainage, or lymphadenopathy bilaterally. Bilateral scars from implants ABDOMEN: Soft, no distention noted.  No tenderness, rebound or guarding.  PELVIC: Normal appearing external genitalia and urethral meatus; normal appearing vaginal mucosa and cervix.  No abnormal discharge noted.  Pap smear not due  Normal uterine size, no other palpable masses, no uterine or adnexal tenderness.  .   Assessment and Plan:    1. Encounter  for annual routine gynecological examination   Pap: not due  Mammogram : n/a  Labs:  declines , has thyroid  checked with another provider Refills: ocp Referral: none Discussed mammogram at 34 due to family history of breast cancer. Discussed BCRA testing.  Routine preventative health maintenance measures emphasized. Please refer to After Visit Summary for other counseling recommendations.      Zelda Hummer, CNM [facility]  [facility],  [facility]

## 2024-01-05 ENCOUNTER — Emergency Department
Admission: EM | Admit: 2024-01-05 | Discharge: 2024-01-05 | Disposition: A | Discharge status: Home / Self Care | Attending: Emergency Medicine | Admitting: Emergency Medicine

## 2024-01-05 ENCOUNTER — Other Ambulatory Visit: Payer: Self-pay

## 2024-01-05 ENCOUNTER — Encounter: Payer: Self-pay | Admitting: Emergency Medicine

## 2024-01-05 ENCOUNTER — Emergency Department

## 2024-01-05 DIAGNOSIS — R112 Nausea with vomiting, unspecified: Secondary | ICD-10-CM | POA: Insufficient documentation

## 2024-01-05 DIAGNOSIS — T50905A Adverse effect of unspecified drugs, medicaments and biological substances, initial encounter: Secondary | ICD-10-CM

## 2024-01-05 DIAGNOSIS — R531 Weakness: Secondary | ICD-10-CM | POA: Diagnosis not present

## 2024-01-05 DIAGNOSIS — R42 Dizziness and giddiness: Secondary | ICD-10-CM | POA: Insufficient documentation

## 2024-01-05 LAB — COMPREHENSIVE METABOLIC PANEL WITH GFR
ALT: 23 U/L (ref 0–44)
AST: 27 U/L (ref 15–41)
Albumin: 3.7 g/dL (ref 3.5–5.0)
Alkaline Phosphatase: 20 U/L — ABNORMAL LOW (ref 38–126)
Anion gap: 6 (ref 5–15)
BUN: 14 mg/dL (ref 6–20)
CO2: 23 mmol/L (ref 22–32)
Calcium: 8.7 mg/dL — ABNORMAL LOW (ref 8.9–10.3)
Chloride: 107 mmol/L (ref 98–111)
Creatinine, Ser: 0.66 mg/dL (ref 0.44–1.00)
GFR, Estimated: >60 mL/min (ref >60)
Glucose, Bld: 89 mg/dL (ref 70–99)
Potassium: 4 mmol/L (ref 3.5–5.1)
Sodium: 136 mmol/L (ref 135–145)
Total Bilirubin: 0.4 mg/dL (ref 0.0–1.2)
Total Protein: 6.9 g/dL (ref 6.5–8.1)

## 2024-01-05 LAB — HCG, QUANTITATIVE, PREGNANCY: hCG, Beta Chain, Quant, S: <1 m[IU]/mL (ref <5)

## 2024-01-05 LAB — CBC WITH DIFFERENTIAL/PLATELET
Abs Immature Granulocytes: 0.03 10*3/uL (ref 0.00–0.07)
Basophils Absolute: 0.1 10*3/uL (ref 0.0–0.1)
Basophils Relative: 1 %
Eosinophils Absolute: 0.1 10*3/uL (ref 0.0–0.5)
Eosinophils Relative: 1 %
HCT: 38.3 % (ref 36.0–46.0)
Hemoglobin: 12.4 g/dL (ref 12.0–15.0)
Immature Granulocytes: 0 %
Lymphocytes Relative: 20 %
Lymphs Abs: 2 10*3/uL (ref 0.7–4.0)
MCH: 29.5 pg (ref 26.0–34.0)
MCHC: 32.4 g/dL (ref 30.0–36.0)
MCV: 91 fL (ref 80.0–100.0)
Monocytes Absolute: 0.5 10*3/uL (ref 0.1–1.0)
Monocytes Relative: 5 %
Neutro Abs: 7.5 10*3/uL (ref 1.7–7.7)
Neutrophils Relative %: 73 %
Platelets: 341 10*3/uL (ref 150–400)
RBC: 4.21 MIL/uL (ref 3.87–5.11)
RDW: 13.7 % (ref 11.5–15.5)
WBC: 10.2 10*3/uL (ref 4.0–10.5)
nRBC: 0 % (ref 0.0–0.2)

## 2024-01-05 LAB — URINALYSIS, ROUTINE W REFLEX MICROSCOPIC
Bilirubin Urine: NEGATIVE
Glucose, UA: NEGATIVE mg/dL
Ketones, ur: NEGATIVE mg/dL
Nitrite: NEGATIVE
Protein, ur: NEGATIVE mg/dL
Specific Gravity, Urine: 1.02 (ref 1.005–1.030)
pH: 7 (ref 5.0–8.0)

## 2024-01-05 LAB — TROPONIN I (HIGH SENSITIVITY)
Troponin I (High Sensitivity): <2 ng/L (ref <18)
Troponin I (High Sensitivity): 3 ng/L (ref <18)

## 2024-01-05 LAB — LIPASE, BLOOD: Lipase: 37 U/L (ref 11–51)

## 2024-01-05 MED ORDER — MECLIZINE HCL 25 MG PO TABS: 25.0000 mg | ORAL_TABLET | Freq: Three times a day (TID) | ORAL | 0 refills | Status: AC | PRN

## 2024-01-05 MED ORDER — SODIUM CHLORIDE 0.9 % IV BOLUS
1000.0000 mL | Freq: Once | INTRAVENOUS | Status: AC
  Administered 2024-01-05: 1000 mL via INTRAVENOUS

## 2024-01-05 MED ORDER — PANTOPRAZOLE SODIUM 40 MG IV SOLR
40.0000 mg | Freq: Once | INTRAVENOUS | Status: AC
  Administered 2024-01-05: 40 mg via INTRAVENOUS
  Filled 2024-01-05: qty 10

## 2024-01-05 MED ORDER — ONDANSETRON 4 MG PO TBDP: 4.0000 mg | ORAL_TABLET | Freq: Three times a day (TID) | ORAL | 0 refills | Status: AC | PRN

## 2024-01-05 MED ORDER — DROPERIDOL 2.5 MG/ML IJ SOLN
1.2500 mg | Freq: Once | INTRAMUSCULAR | Status: AC
  Administered 2024-01-05: 1.25 mg via INTRAVENOUS
  Filled 2024-01-05: qty 2

## 2024-01-05 MED ORDER — ONDANSETRON HCL 4 MG/2ML IJ SOLN
4.0000 mg | Freq: Once | INTRAMUSCULAR | Status: AC
  Administered 2024-01-05: 4 mg via INTRAVENOUS
  Filled 2024-01-05: qty 2

## 2024-01-05 MED ORDER — IOHEXOL 350 MG/ML SOLN
75.0000 mL | Freq: Once | INTRAVENOUS | Status: AC | PRN
  Administered 2024-01-05: 75 mL via INTRAVENOUS

## 2024-01-05 NOTE — ED Notes (Addendum)
 Pt ambulatory to the restroom with standby assistance from the husband.

## 2024-01-05 NOTE — ED Notes (Signed)
 Pt currently sleeping with significant other at bedside. Pt states nausea is better but feels dizzy. Denies any other needs at this time.

## 2024-01-05 NOTE — Discharge Instructions (Addendum)
 IMPRESSION: 1. Distended urinary bladder with mild bilateral hydronephrosis. No obstructing stone. 2. Moderate colonic stool burden. No bowel obstruction.   No large vessel occlusion, high-grade stenosis, or dissection of the arteries in the head and neck.   2 mm outpouching along the left supraclinoid ICA likely reflecting an infundibular origin of the posterior communicating artery versus small aneurysm.   No CT evidence of acute intracranial abnormali

## 2024-01-05 NOTE — ED Triage Notes (Signed)
 Pt via [facility] from home. Pt c/o NV since last night. Reports her chiropractor gave her a new CBD gummy that she had last night. Reports the first time she has had one and since then reports NV. Denies pain. Pt is A&Ox4 and NAD.

## 2024-01-05 NOTE — ED Notes (Addendum)
 Pt transported to CT at this time.

## 2024-01-05 NOTE — ED Provider Notes (Signed)
 [facility] Provider Note    Event Date/Time   First MD Initiated Contact with Patient 01/05/24 [phone]     (approximate)   History   No chief complaint on file.   HPI  Adrienne Alvarado is a 33 y.o. female who is otherwise healthy who comes in with concerns for nausea, vomiting.  Patient reports that last night she used some type of gummy.  She reports using CBD Gummies in the past and she is not sure if it was a CBD gummy or THC gummy.  She states that in the last night she developed multiple episodes of nausea, vomiting.  She reports feeling some generalized weakness but no chest pain, no shortness of breath.  She reports that she denies any abdominal pain.  She reports that the vomit is not bloody or bile in nature.  With EMS they report diarrhea but she states that she really has not had any diarrhea.  She reports just some generalized weakness associated with the vomiting.  Patient for symptoms started around 6 AM.  Went to bed at 10 PM and felt normal.     Physical Exam   Triage Vital Signs: ED Triage Vitals  Encounter Vitals Group     BP      Systolic BP Percentile      Diastolic BP Percentile      Pulse      Resp      Temp      Temp src      SpO2      Weight      Height      Head Circumference      Peak Flow      Pain Score      Pain Loc      Pain Education      Exclude from Growth Chart     Most recent vital signs: Vitals:   01/05/24 0900 01/05/24 0930  BP: 103/68 90/61  Pulse: (!) 114 (!) 102  Resp:  18  Temp:    SpO2: 100% 100%     General: Awake, no distress.  CV:  Good peripheral perfusion.  Resp:  Normal effort.  Abd:  No distention.  Soft and nontender Other:  Patient appears sleepy and states when she opens her eyes she feels very dizzy.  She has normal finger-to-nose.  Cranial nerves appear intact   ED Results / Procedures / Treatments   Labs (all labs ordered are listed, but only abnormal results are  displayed) Labs Reviewed  COMPREHENSIVE METABOLIC PANEL WITH GFR - Abnormal; Notable for the following components:      Result Value   Calcium 8.7 (*)    Alkaline Phosphatase 20 (*)    All other components within normal limits  CBC WITH DIFFERENTIAL/PLATELET  HCG, QUANTITATIVE, PREGNANCY  LIPASE, BLOOD  URINALYSIS, ROUTINE W REFLEX MICROSCOPIC  TROPONIN I (HIGH SENSITIVITY)     EKG  My interpretation of EKG:  Sinus tachycardia rate of 107 without any ST elevation or T wave inversions, normal intervals  RADIOLOGY I have reviewed the  ct personally and interpreted distended bladder    PROCEDURES:  Critical Care performed: No  Procedures   MEDICATIONS ORDERED IN ED: Medications  sodium chloride  0.9 % bolus 1,000 mL (0 mLs Intravenous Stopped 01/05/24 0949)  pantoprazole (PROTONIX) injection 40 mg (40 mg Intravenous Given 01/05/24 0839)  ondansetron  (ZOFRAN ) injection 4 mg (4 mg Intravenous Given 01/05/24 0839)  droperidol (INAPSINE) 2.5 MG/ML injection 1.25  mg (1.25 mg Intravenous Given 01/05/24 1048)  sodium chloride  0.9 % bolus 1,000 mL (1,000 mLs Intravenous New Bag/Given 01/05/24 1015)     IMPRESSION / MDM / ASSESSMENT AND PLAN / ED COURSE  I reviewed the triage vital signs and the nursing notes.   Patient's presentation is most consistent with acute presentation with potential threat to life or bodily function.   Patient comes in with dizziness, nausea, vomiting after CBD Gummies.  Could be related to CBD's will check electrolytes, give fluids, ensure no pregnancy, ACS  Troponin is negative, CBC reassuring, CMP reassuring, pregnancy test negative  On repeat evaluation patient's husband is now at bedside who does report that she had a syncopal episode while vomiting.  Did not fall down did not hit her head.  He does report that she has been seeing a Land.  No obvious neck manipulation prior to this happening.  They do report however that she had some jerking  movement.  Does not sound like a seizure per se but he just stated that some movements were very strange and so he brought her in to be evaluated.  On repeat evaluation her abdomen does seem tender to evaluation.  After discussion with family we will get CTA to ensure no dissection from chiropractor, intracranial stenosis putting her at higher risk for posterior stroke.  Patient out of the window for stroke code given last known normal was 10 PM yesterday.  Will also get CT abdomen to assure no abdominal pathology given she does report a little bit of abdominal pain.  No large vessel occlusion, high-grade stenosis, or dissection of the  arteries in the head and neck.    2 mm outpouching along the left supraclinoid ICA likely reflecting  an infundibular origin of the posterior communicating artery versus  small aneurysm.    No CT evidence of acute intracranial abnormality.    IMPRESSION: 1. Distended urinary bladder with mild bilateral hydronephrosis. No obstructing stone. 2. Moderate colonic stool burden. No bowel obstruction.    Patient reportedly really had to go to the bathroom prior to CT imaging and when she came back she did have a large amount of urine come out.  Will get a bladder scan to ensure that she fully voided.  Her CT scans were provided in her discharge.  I do not believe this represents an aneurysm rupture given she is not really having any headache it is more like dizziness but I have given her a copy of the reports for her follow-up.  Patient was able to urinate again and had a postvoid residual of 42 mL she has no weakness or numbness in her legs and I think that just because she was sleepy which is why she did not want to get up and go to the bathroom but after she did attempt to void she was able to fully void denies any evidence of cord compression no evidence of UTI.  2:01 PM on repeat assessment we discussed MRI to evaluate for stroke but have opted to decline given  she is got low suspicion for posterior stroke.  She denies any smoking and her CTA was negative.  She states she does not really feel dizzy anymore she just feels extremely tired from being up all night.  She states that she prefer to go home and monitor symptoms at home.  Will prescribe some Zofran , meclizine.  They understand that they can return if symptoms are worsening but I suspect this is most likely  related to the gummy that she took last night and the nausea and vomiting leading to dehydration.  Patient provided a copy of reports     FINAL CLINICAL IMPRESSION(S) / ED DIAGNOSES   Final diagnoses:  Nausea and vomiting, unspecified vomiting type  Dizziness     Rx / DC Orders   ED Discharge Orders     None        Note:  This document was prepared using Dragon voice recognition software and may include unintentional dictation errors.   Lubertha Rush, MD 01/05/24 [phone]

## 2024-01-14 ENCOUNTER — Encounter: Payer: Self-pay | Admitting: Certified Nurse Midwife

## 2024-08-01 ENCOUNTER — Encounter: Payer: Self-pay | Admitting: Certified Nurse Midwife
